# Patient Record
Sex: Female | Born: 1965 | Race: White | Hispanic: No | Marital: Married | Smoking: Former smoker
Health system: Southern US, Community
[De-identification: ages and names within clinical notes are randomized; demographics above are authoritative.]

## PROBLEM LIST (undated history)

## (undated) DIAGNOSIS — E119 Type 2 diabetes mellitus without complications: Secondary | ICD-10-CM

## (undated) DIAGNOSIS — G4730 Sleep apnea, unspecified: Secondary | ICD-10-CM

## (undated) DIAGNOSIS — E039 Hypothyroidism, unspecified: Secondary | ICD-10-CM

## (undated) DIAGNOSIS — C801 Malignant (primary) neoplasm, unspecified: Secondary | ICD-10-CM

## (undated) DIAGNOSIS — I10 Essential (primary) hypertension: Secondary | ICD-10-CM

## (undated) DIAGNOSIS — E7800 Pure hypercholesterolemia, unspecified: Secondary | ICD-10-CM

## (undated) DIAGNOSIS — F419 Anxiety disorder, unspecified: Secondary | ICD-10-CM

## (undated) DIAGNOSIS — K7460 Unspecified cirrhosis of liver: Secondary | ICD-10-CM

## (undated) DIAGNOSIS — E079 Disorder of thyroid, unspecified: Secondary | ICD-10-CM

## (undated) DIAGNOSIS — M1990 Unspecified osteoarthritis, unspecified site: Secondary | ICD-10-CM

## (undated) HISTORY — DX: Malignant (primary) neoplasm, unspecified: C80.1

## (undated) HISTORY — DX: Unspecified osteoarthritis, unspecified site: M19.90

## (undated) HISTORY — PX: ABDOMINAL HYSTERECTOMY: SHX81

## (undated) HISTORY — PX: JOINT REPLACEMENT: SHX530

## (undated) HISTORY — DX: Unspecified cirrhosis of liver: K74.60

## (undated) HISTORY — PX: THUMB ARTHROSCOPY: SHX2509

---

## 1998-09-22 ENCOUNTER — Encounter: Payer: Self-pay | Admitting: Obstetrics and Gynecology

## 1998-09-22 ENCOUNTER — Ambulatory Visit (HOSPITAL_COMMUNITY): Admission: RE | Admit: 1998-09-22 | Discharge: 1998-09-22 | Payer: Self-pay | Admitting: Obstetrics and Gynecology

## 2012-05-25 ENCOUNTER — Encounter (HOSPITAL_BASED_OUTPATIENT_CLINIC_OR_DEPARTMENT_OTHER): Payer: Self-pay | Admitting: Emergency Medicine

## 2012-05-25 ENCOUNTER — Emergency Department (HOSPITAL_BASED_OUTPATIENT_CLINIC_OR_DEPARTMENT_OTHER): Payer: Self-pay

## 2012-05-25 ENCOUNTER — Emergency Department (HOSPITAL_BASED_OUTPATIENT_CLINIC_OR_DEPARTMENT_OTHER)
Admission: EM | Admit: 2012-05-25 | Discharge: 2012-05-25 | Disposition: A | Discharge status: Home / Self Care | Payer: Self-pay | Attending: Emergency Medicine | Admitting: Emergency Medicine

## 2012-05-25 DIAGNOSIS — M1710 Unilateral primary osteoarthritis, unspecified knee: Secondary | ICD-10-CM | POA: Insufficient documentation

## 2012-05-25 DIAGNOSIS — M1990 Unspecified osteoarthritis, unspecified site: Secondary | ICD-10-CM

## 2012-05-25 DIAGNOSIS — M25569 Pain in unspecified knee: Secondary | ICD-10-CM

## 2012-05-25 DIAGNOSIS — Z87891 Personal history of nicotine dependence: Secondary | ICD-10-CM | POA: Insufficient documentation

## 2012-05-25 MED ORDER — HYDROCODONE-ACETAMINOPHEN 5-325 MG PO TABS
1.0000 | ORAL_TABLET | Freq: Four times a day (QID) | ORAL | Status: AC | PRN
Start: 2012-05-25 — End: 2012-06-04

## 2012-05-25 MED ORDER — HYDROCODONE-ACETAMINOPHEN 5-325 MG PO TABS
1.0000 | ORAL_TABLET | Freq: Once | ORAL | Status: AC
  Administered 2012-05-25: 1 via ORAL
  Filled 2012-05-25: qty 1

## 2012-05-25 MED ORDER — NAPROXEN 500 MG PO TABS: 500.0000 mg | ORAL_TABLET | Freq: Two times a day (BID) | ORAL | Status: AC

## 2012-05-25 NOTE — ED Provider Notes (Signed)
History  This chart was scribed for Mervin Kung, MD by Lovena Le Day. This patient was seen in room MH11/MH11 and the patient's care was started at 1424.   CSN: [id number]  Arrival date & time 05/25/12  1424   First MD Initiated Contact with Patient 05/25/12 1506      Chief Complaint  Patient presents with  . Knee Pain   Patient is a 46 y.o. female presenting with knee pain. The history is provided by the patient. No language interpreter was used.  Knee Pain This is a chronic problem. The current episode started more than 2 days ago. The problem occurs constantly. The problem has been gradually worsening. Associated symptoms include shortness of breath (At baseline. ). Pertinent negatives include no chest pain and no abdominal pain. The symptoms are aggravated by walking and bending. Nothing relieves the symptoms. Treatments tried: Ibuprofen.  The treatment provided no relief.   Priscilla Roberts is a 46 y.o. female who presents to the Emergency Department with history of chronic knee pain complaining of constant 10/10 left knee pain which started three days ago. She states pain and swelling to her knee which is worse with walking and sometimes she hears a popping noise.She saw an orthopedist 3 years ago who performed a knee X-ray and is unsure of what caused her pain. She has tried taking ibuprofen for pain at home without any relief from her symptoms. She states some SOB which is at her baseline as well as back pain which is also at baseline. She denies any numbness/swelling in her left foot. She denies any nausea, emesis, diarrhea, chest pain, and urinary symptoms.   Her PCP is Dr. Linwood Dibbles at [facility].   History reviewed. No pertinent past medical history.  Past Surgical History  Procedure Date  . Thumb arthroscopy   . Abdominal hysterectomy     No family history on file.  History  Substance Use Topics  . Smoking status: Former Research scientist (life sciences)  . Smokeless tobacco: Not on file   . Alcohol Use: No    OB History    Grav Para Term Preterm Abortions TAB SAB Ect Mult Living                  Review of Systems  Constitutional: Negative for fever and chills.  HENT: Negative for congestion.   Respiratory: Positive for shortness of breath (At baseline. ).   Cardiovascular: Negative for chest pain.  Gastrointestinal: Negative for nausea, vomiting, abdominal pain and diarrhea.  Genitourinary: Negative for dysuria and difficulty urinating.  Musculoskeletal: Positive for back pain (At baseline. ) and joint swelling (Left knee swollen).       Left knee pain.   Neurological: Negative for weakness and numbness.  All other systems reviewed and are negative.    Allergies  Tramadol  Home Medications   Current Outpatient Rx  Name Route Sig Dispense Refill  . ATENOLOL 25 MG PO TABS Oral Take 25 mg by mouth daily.    . ATORVASTATIN CALCIUM 10 MG PO TABS Oral Take 10 mg by mouth every evening.     . CYCLOBENZAPRINE HCL 10 MG PO TABS Oral Take 10 mg by mouth 3 (three) times daily as needed. For muscle spasms    . GABAPENTIN 300 MG PO CAPS Oral Take 300 mg by mouth 3 (three) times daily.    Marland Kitchen GLIPIZIDE 10 MG PO TABS Oral Take 20 mg by mouth 2 (two) times daily before a meal.    .  HYDROCHLOROTHIAZIDE 25 MG PO TABS Oral Take 25 mg by mouth daily.    . IBUPROFEN 200 MG PO TABS Oral Take 1,000 mg by mouth once as needed. For headache    . LEVOTHYROXINE SODIUM 150 MCG PO TABS Oral Take 150 mcg by mouth daily.    Marland Kitchen LISINOPRIL 2.5 MG PO TABS Oral Take 2.5 mg by mouth daily.    . MELOXICAM 15 MG PO TABS Oral Take 15 mg by mouth daily.    Marland Kitchen METFORMIN HCL 1000 MG PO TABS Oral Take 1,000 mg by mouth 2 (two) times daily with a meal.    . ADULT MULTIVITAMIN W/MINERALS CH Oral Take 1 tablet by mouth daily.    Marland Kitchen HAIR VITAMINS PO Oral Take 1 tablet by mouth daily.    Marland Kitchen SALINE NASAL SPRAY 0.65 % NA SOLN Nasal Place 2 sprays into the nose 2 (two) times daily as needed. For nasal congestion     . HYDROCODONE-ACETAMINOPHEN 5-325 MG PO TABS Oral Take 1-2 tablets by mouth every 6 (six) hours as needed for pain. 14 tablet 0  . NAPROXEN 500 MG PO TABS Oral Take 1 tablet (500 mg total) by mouth 2 (two) times daily. 14 tablet 0    Triage Vitals: BP 148/81  Pulse 87  Resp 16  Ht 5' 6"  (1.676 m)  Wt 220 lb (99.791 kg)  BMI 35.51 kg/m2  SpO2 98%  Physical Exam  Nursing note and vitals reviewed. Constitutional: She is oriented to person, place, and time. She appears well-developed and well-nourished. No distress.  HENT:  Head: Normocephalic and atraumatic.  Eyes: EOM are normal.  Neck: Neck supple. No tracheal deviation present.  Cardiovascular: Normal rate, regular rhythm and normal heart sounds.   No murmur heard. Pulmonary/Chest: Effort normal and breath sounds normal. No respiratory distress. She has no wheezes. She has no rales.  Abdominal: Soft. Bowel sounds are normal. She exhibits no distension. There is no tenderness. There is no rebound and no guarding.  Musculoskeletal: Normal range of motion. She exhibits edema and tenderness.       Small effusion of her left knee, minimally swollen, joint line tenderness.. Hint of increased warmth of her left knee . DP left and right 2+ , capillary refill bilateral big toes 1 second.  Neurological: She is alert and oriented to person, place, and time.  Skin: Skin is warm and dry.  Psychiatric: She has a normal mood and affect. Her behavior is normal.    ED Course  Procedures (including critical care time) DIAGNOSTIC STUDIES: Oxygen Saturation is 98% on room air, normal by my interpretation.    COORDINATION OF CARE: At 315 Discussed treatment plan with patient which includes follow up with her orthopedist. Patient agrees.   Labs Reviewed - No data to display Dg Knee Complete 4 Views Left  05/25/2012  *RADIOLOGY REPORT*  Clinical Data: Left knee pain  LEFT KNEE - COMPLETE 4+ VIEW  Comparison: None.  Findings: Mild  tricompartmental degenerative changes.  No fracture or dislocation is seen.  The visualized soft tissues are unremarkable.  Possible small suprapatellar knee joint effusion.  IMPRESSION: Mild tricompartmental degenerative changes.  Possible small suprapatellar knee joint effusion.   Original Report Authenticated By: Julian Hy, M.D.      1. Knee pain   2. Osteoarthritis       MDM  X-ray findings show significant tricompartmental degenerative changes even though they're mild and a small effusion all suggestive arthritic osteoarthritic changes. Take anti-inflammatory medicines and  we'll supplement with pain medicines orthopedic referral sports medicine referral provided.  I personally performed the services described in this documentation, which was scribed in my presence. The recorded information has been reviewed and considered.           Mervin Kung, MD 05/25/12 1700

## 2012-05-25 NOTE — ED Notes (Signed)
Pt c/o LT knee pain, chronic, but worse over last 2-3 days

## 2012-05-31 ENCOUNTER — Ambulatory Visit (HOSPITAL_COMMUNITY)
Admission: RE | Admit: 2012-05-31 | Discharge: 2012-05-31 | Disposition: A | Discharge status: Home / Self Care | Payer: Self-pay | Source: Ambulatory Visit | Attending: Orthopedic Surgery | Admitting: Orthopedic Surgery

## 2012-05-31 DIAGNOSIS — M7989 Other specified soft tissue disorders: Secondary | ICD-10-CM | POA: Insufficient documentation

## 2012-05-31 DIAGNOSIS — M79609 Pain in unspecified limb: Secondary | ICD-10-CM | POA: Insufficient documentation

## 2012-05-31 DIAGNOSIS — M79606 Pain in leg, unspecified: Secondary | ICD-10-CM

## 2012-05-31 NOTE — Progress Notes (Signed)
VASCULAR LAB PRELIMINARY  PRELIMINARY  PRELIMINARY  PRELIMINARY  Left lower extremity venous duplex completed.    Preliminary report:  Left:  No evidence of DVT, superficial thrombosis, or Baker's cyst.  Priscilla Roberts, RVT 05/31/2012, 11:47 AM

## 2012-07-24 ENCOUNTER — Emergency Department (HOSPITAL_BASED_OUTPATIENT_CLINIC_OR_DEPARTMENT_OTHER)
Admission: EM | Admit: 2012-07-24 | Discharge: 2012-07-24 | Disposition: A | Discharge status: Home / Self Care | Payer: Self-pay | Attending: Emergency Medicine | Admitting: Emergency Medicine

## 2012-07-24 ENCOUNTER — Emergency Department (HOSPITAL_BASED_OUTPATIENT_CLINIC_OR_DEPARTMENT_OTHER): Payer: Self-pay

## 2012-07-24 ENCOUNTER — Encounter (HOSPITAL_BASED_OUTPATIENT_CLINIC_OR_DEPARTMENT_OTHER): Payer: Self-pay

## 2012-07-24 DIAGNOSIS — Z79899 Other long term (current) drug therapy: Secondary | ICD-10-CM | POA: Insufficient documentation

## 2012-07-24 DIAGNOSIS — E079 Disorder of thyroid, unspecified: Secondary | ICD-10-CM | POA: Insufficient documentation

## 2012-07-24 DIAGNOSIS — IMO0002 Reserved for concepts with insufficient information to code with codable children: Secondary | ICD-10-CM | POA: Insufficient documentation

## 2012-07-24 DIAGNOSIS — E785 Hyperlipidemia, unspecified: Secondary | ICD-10-CM | POA: Insufficient documentation

## 2012-07-24 DIAGNOSIS — Y9389 Activity, other specified: Secondary | ICD-10-CM | POA: Insufficient documentation

## 2012-07-24 DIAGNOSIS — Y929 Unspecified place or not applicable: Secondary | ICD-10-CM | POA: Insufficient documentation

## 2012-07-24 DIAGNOSIS — S63601A Unspecified sprain of right thumb, initial encounter: Secondary | ICD-10-CM

## 2012-07-24 DIAGNOSIS — E119 Type 2 diabetes mellitus without complications: Secondary | ICD-10-CM | POA: Insufficient documentation

## 2012-07-24 DIAGNOSIS — I10 Essential (primary) hypertension: Secondary | ICD-10-CM | POA: Insufficient documentation

## 2012-07-24 DIAGNOSIS — Z87891 Personal history of nicotine dependence: Secondary | ICD-10-CM | POA: Insufficient documentation

## 2012-07-24 HISTORY — DX: Type 2 diabetes mellitus without complications: E11.9

## 2012-07-24 HISTORY — DX: Disorder of thyroid, unspecified: E07.9

## 2012-07-24 HISTORY — DX: Essential (primary) hypertension: I10

## 2012-07-24 HISTORY — DX: Pure hypercholesterolemia, unspecified: E78.00

## 2012-07-24 MED ORDER — IBUPROFEN 800 MG PO TABS: 800.0000 mg | ORAL_TABLET | Freq: Three times a day (TID) | ORAL | Status: DC

## 2012-07-24 MED ORDER — HYDROCODONE-ACETAMINOPHEN 5-325 MG PO TABS: 2.0000 | ORAL_TABLET | ORAL | Status: DC | PRN

## 2012-07-24 MED ORDER — HYDROCODONE-ACETAMINOPHEN 5-325 MG PO TABS
2.0000 | ORAL_TABLET | Freq: Once | ORAL | Status: AC
  Administered 2012-07-24: 2 via ORAL
  Filled 2012-07-24: qty 2

## 2012-07-24 NOTE — ED Notes (Signed)
C/o pain to right thumb x 1 week-denies injury

## 2012-07-24 NOTE — ED Provider Notes (Signed)
Medical screening examination/treatment/procedure(s) were performed by non-physician practitioner and as supervising physician I was immediately available for consultation/collaboration.   Carmin Muskrat, MD 07/24/12 2125

## 2012-07-24 NOTE — ED Provider Notes (Signed)
History     CSN: [id number]  Arrival date & time 07/24/12  0160   First MD Initiated Contact with Patient 07/24/12 1853      Chief Complaint  Patient presents with  . Hand Pain    (Consider location/radiation/quality/duration/timing/severity/associated sxs/prior treatment) Patient is a 45 y.o. female presenting with hand injury. The history is provided by the patient. No language interpreter was used.  Hand Injury  The incident occurred more than 1 week ago. The incident occurred at home. The injury mechanism was a direct blow. The pain is present in the right hand. The quality of the pain is described as aching. The pain is at a severity of 4/10. The pain is moderate. The pain has been constant since the incident. She reports no foreign bodies present. The symptoms are aggravated by movement. She has tried nothing for the symptoms. The treatment provided no relief.  Pt reports she spanked a child last week and hand has been sore since.  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  Past Medical History  Diagnosis Date  . Diabetes mellitus without complication   . Hypertension   . Thyroid disease   . High cholesterol     Past Surgical History  Procedure Date  . Thumb arthroscopy   . Abdominal hysterectomy     No family history on file.  History  Substance Use Topics  . Smoking status: Former Research scientist (life sciences)  . Smokeless tobacco: Not on file  . Alcohol Use: No    OB History    Grav Para Term Preterm Abortions TAB SAB Ect Mult Living                  Review of Systems  Musculoskeletal: Positive for joint swelling.  All other systems reviewed and are negative.    Allergies  Tramadol  Home Medications   Current Outpatient Rx  Name Route Sig Dispense Refill  . ATENOLOL 25 MG PO TABS Oral Take 25 mg by mouth daily.    . ATORVASTATIN CALCIUM 10 MG PO TABS Oral Take 10 mg by mouth every evening.     . CYCLOBENZAPRINE HCL 10 MG PO  TABS Oral Take 10 mg by mouth 3 (three) times daily as needed. For muscle spasms    . GABAPENTIN 300 MG PO CAPS Oral Take 300 mg by mouth 3 (three) times daily.    Marland Kitchen GLIPIZIDE 10 MG PO TABS Oral Take 20 mg by mouth 2 (two) times daily before a meal.    . HYDROCHLOROTHIAZIDE 25 MG PO TABS Oral Take 25 mg by mouth daily.    . IBUPROFEN 200 MG PO TABS Oral Take 1,000 mg by mouth once as needed. For headache    . LEVOTHYROXINE SODIUM 150 MCG PO TABS Oral Take 150 mcg by mouth daily.    Marland Kitchen LISINOPRIL 2.5 MG PO TABS Oral Take 2.5 mg by mouth daily.    . MELOXICAM 15 MG PO TABS Oral Take 15 mg by mouth daily.    Marland Kitchen METFORMIN HCL 1000 MG PO TABS Oral Take 1,000 mg by mouth 2 (two) times daily with a meal.    . ADULT MULTIVITAMIN W/MINERALS CH Oral Take 1 tablet by mouth daily.    Marland Kitchen HAIR VITAMINS PO Oral Take 1 tablet by mouth daily.    Marland Kitchen NAPROXEN 500 MG PO TABS Oral Take 1 tablet (500 mg total) by mouth 2 (two) times daily. 14 tablet 0  .  SALINE NASAL SPRAY 0.65 % NA SOLN Nasal Place 2 sprays into the nose 2 (two) times daily as needed. For nasal congestion      BP 143/79  Pulse 100  Temp 99 F (37.2 C) (Oral)  Resp 16  Ht 5' 6"  (1.676 m)  Wt 226 lb (102.513 kg)  BMI 36.48 kg/m2  SpO2 96%  Physical Exam  Nursing note and vitals reviewed. Constitutional: She is oriented to person, place, and time. She appears well-developed and well-nourished.  HENT:  Head: Normocephalic and atraumatic.  Musculoskeletal: She exhibits tenderness.       Tender right thumb,  Swelling thenar prominence.    Neurological: She is alert and oriented to person, place, and time. She has normal reflexes.  Skin: Skin is warm.  Psychiatric: She has a normal mood and affect.    ED Course  Procedures (including critical care time)  Labs Reviewed - No data to display Dg Hand Complete Right  07/24/2012  *RADIOLOGY REPORT*  Clinical Data: Right thumb pain.  RIGHT HAND - COMPLETE 3+ VIEW  Comparison: None.  Findings:  Three views of the right hand were obtained.  There is a 5 mm linear density and a smaller adjacent radiopaque structure within the palmar soft tissues overlying the second metacarpal bone.  Findings are consistent with small foreign bodies.  There are degenerative changes at the first carpometacarpal joint.  Old injury or incomplete ossification of the ulnar styloid. Degenerative changes involving the middle finger DIP joint.  Mild degenerative changes involving the thumb MCP joint.  IMPRESSION: Osteoarthritis as described.  No acute bony abnormality.  Two small foreign bodies in the soft tissues overlying the second metacarpal bone.   Original Report Authenticated By: Markus Daft, M.D.      1. Sprain of hand, thumb, right       MDM  Pt placed in thumb spica.  I advised wear for 1 week.  Follow up with Dr. Burney Gauze or Orthopaedist of your choice in 1 week if pain persist       [place], [place] 07/24/12 [place], [place] 07/24/12 [phone]

## 2012-09-30 ENCOUNTER — Emergency Department (HOSPITAL_BASED_OUTPATIENT_CLINIC_OR_DEPARTMENT_OTHER)
Admission: EM | Admit: 2012-09-30 | Discharge: 2012-09-30 | Disposition: A | Discharge status: Home / Self Care | Payer: Self-pay | Attending: Emergency Medicine | Admitting: Emergency Medicine

## 2012-09-30 ENCOUNTER — Encounter (HOSPITAL_BASED_OUTPATIENT_CLINIC_OR_DEPARTMENT_OTHER): Payer: Self-pay

## 2012-09-30 ENCOUNTER — Emergency Department (HOSPITAL_BASED_OUTPATIENT_CLINIC_OR_DEPARTMENT_OTHER): Payer: Self-pay

## 2012-09-30 DIAGNOSIS — R197 Diarrhea, unspecified: Secondary | ICD-10-CM | POA: Insufficient documentation

## 2012-09-30 DIAGNOSIS — B9789 Other viral agents as the cause of diseases classified elsewhere: Secondary | ICD-10-CM | POA: Insufficient documentation

## 2012-09-30 DIAGNOSIS — E079 Disorder of thyroid, unspecified: Secondary | ICD-10-CM | POA: Insufficient documentation

## 2012-09-30 DIAGNOSIS — G43909 Migraine, unspecified, not intractable, without status migrainosus: Secondary | ICD-10-CM

## 2012-09-30 DIAGNOSIS — Z79899 Other long term (current) drug therapy: Secondary | ICD-10-CM | POA: Insufficient documentation

## 2012-09-30 DIAGNOSIS — R112 Nausea with vomiting, unspecified: Secondary | ICD-10-CM | POA: Insufficient documentation

## 2012-09-30 DIAGNOSIS — J209 Acute bronchitis, unspecified: Secondary | ICD-10-CM | POA: Insufficient documentation

## 2012-09-30 DIAGNOSIS — E7800 Pure hypercholesterolemia, unspecified: Secondary | ICD-10-CM | POA: Insufficient documentation

## 2012-09-30 DIAGNOSIS — J029 Acute pharyngitis, unspecified: Secondary | ICD-10-CM | POA: Insufficient documentation

## 2012-09-30 DIAGNOSIS — R062 Wheezing: Secondary | ICD-10-CM | POA: Insufficient documentation

## 2012-09-30 DIAGNOSIS — H53149 Visual discomfort, unspecified: Secondary | ICD-10-CM | POA: Insufficient documentation

## 2012-09-30 DIAGNOSIS — E119 Type 2 diabetes mellitus without complications: Secondary | ICD-10-CM | POA: Insufficient documentation

## 2012-09-30 DIAGNOSIS — I10 Essential (primary) hypertension: Secondary | ICD-10-CM | POA: Insufficient documentation

## 2012-09-30 DIAGNOSIS — B349 Viral infection, unspecified: Secondary | ICD-10-CM

## 2012-09-30 DIAGNOSIS — Z87891 Personal history of nicotine dependence: Secondary | ICD-10-CM | POA: Insufficient documentation

## 2012-09-30 DIAGNOSIS — J3489 Other specified disorders of nose and nasal sinuses: Secondary | ICD-10-CM | POA: Insufficient documentation

## 2012-09-30 MED ORDER — ALBUTEROL SULFATE HFA 108 (90 BASE) MCG/ACT IN AERS
2.0000 | INHALATION_SPRAY | Freq: Once | RESPIRATORY_TRACT | Status: AC
  Administered 2012-09-30: 2 via RESPIRATORY_TRACT
  Filled 2012-09-30: qty 6.7

## 2012-09-30 MED ORDER — METOCLOPRAMIDE HCL 10 MG PO TABS
10.0000 mg | ORAL_TABLET | Freq: Once | ORAL | Status: AC
  Administered 2012-09-30: 10 mg via ORAL
  Filled 2012-09-30: qty 1

## 2012-09-30 MED ORDER — PREDNISONE 20 MG PO TABS: 40.0000 mg | ORAL_TABLET | Freq: Every day | ORAL | Status: DC

## 2012-09-30 MED ORDER — OXYCODONE-ACETAMINOPHEN 5-325 MG PO TABS
1.0000 | ORAL_TABLET | Freq: Once | ORAL | Status: AC
  Administered 2012-09-30: 1 via ORAL
  Filled 2012-09-30 (×2): qty 1

## 2012-09-30 MED ORDER — BENZONATATE 100 MG PO CAPS: 200.0000 mg | ORAL_CAPSULE | Freq: Two times a day (BID) | ORAL | Status: DC | PRN

## 2012-09-30 NOTE — ED Notes (Signed)
Prod cough x 1 month-no medical treatment-taking OTC meds w/o relief

## 2012-09-30 NOTE — ED Provider Notes (Signed)
History  This chart was scribed for Priscilla Mayo, MD by Era Bumpers, ED scribe. This patient was seen in room MH05/MH05 and the patient's care was started at 1516.   CSN: [id number]  Arrival date & time 09/30/12  1516   First MD Initiated Contact with Patient 09/30/12 1630      Chief Complaint  Patient presents with  . Cough   Patient is a 46 y.o. female presenting with cough. The history is provided by the patient. No language interpreter was used.  Cough This is a new problem. The current episode started more than 1 week ago. The problem occurs constantly. The problem has been gradually worsening. The cough is productive of brown sputum. There has been no fever. Associated symptoms include headaches, sore throat and wheezing. Pertinent negatives include no chest pain, no chills and no shortness of breath. She has tried nothing for the symptoms. She is not a smoker.   ALAYSHA Roberts is a 46 y.o. female who presents to the Emergency Department w/hx of migraines complaining of constant gradually worsening productive (green sputum) cough for the past month which began as a sore thorat with associated HA, wheezing, nausea, emesis, and diarrhea. She states photophobia, which is normal with past migraine episodes. She states no relief with OTC medicines at home.   She has no PCP  Past Medical History  Diagnosis Date  . Diabetes mellitus without complication   . Hypertension   . Thyroid disease   . High cholesterol     Past Surgical History  Procedure Date  . Thumb arthroscopy   . Abdominal hysterectomy     No family history on file.  History  Substance Use Topics  . Smoking status: Former Research scientist (life sciences)  . Smokeless tobacco: Not on file  . Alcohol Use: No    OB History    Grav Para Term Preterm Abortions TAB SAB Ect Mult Living                  Review of Systems  Constitutional: Negative for fever and chills.  HENT: Positive for congestion and sore throat.   Eyes:  Positive for photophobia.  Respiratory: Positive for cough and wheezing. Negative for shortness of breath.   Cardiovascular: Negative for chest pain.  Gastrointestinal: Positive for nausea, vomiting and diarrhea. Negative for abdominal pain.  Neurological: Positive for headaches. Negative for weakness.  All other systems reviewed and are negative.    Allergies  Tramadol  Home Medications   Current Outpatient Rx  Name  Route  Sig  Dispense  Refill  . ATENOLOL 25 MG PO TABS   Oral   Take 25 mg by mouth daily.         . ATORVASTATIN CALCIUM 10 MG PO TABS   Oral   Take 10 mg by mouth every evening.          . CYCLOBENZAPRINE HCL 10 MG PO TABS   Oral   Take 10 mg by mouth 3 (three) times daily as needed. For muscle spasms         . GABAPENTIN 300 MG PO CAPS   Oral   Take 300 mg by mouth 3 (three) times daily.         Marland Kitchen GLIPIZIDE 10 MG PO TABS   Oral   Take 20 mg by mouth 2 (two) times daily before a meal.         . HYDROCHLOROTHIAZIDE 25 MG PO TABS   Oral  Take 25 mg by mouth daily.         Marland Kitchen HYDROCODONE-ACETAMINOPHEN 5-325 MG PO TABS   Oral   Take 2 tablets by mouth every 4 (four) hours as needed for pain.   10 tablet   0   . IBUPROFEN 200 MG PO TABS   Oral   Take 1,000 mg by mouth once as needed. For headache         . IBUPROFEN 800 MG PO TABS   Oral   Take 1 tablet (800 mg total) by mouth 3 (three) times daily.   21 tablet   0   . LEVOTHYROXINE SODIUM 150 MCG PO TABS   Oral   Take 150 mcg by mouth daily.         Marland Kitchen LISINOPRIL 2.5 MG PO TABS   Oral   Take 2.5 mg by mouth daily.         . MELOXICAM 15 MG PO TABS   Oral   Take 15 mg by mouth daily.         Marland Kitchen METFORMIN HCL 1000 MG PO TABS   Oral   Take 1,000 mg by mouth 2 (two) times daily with a meal.         . ADULT MULTIVITAMIN W/MINERALS CH   Oral   Take 1 tablet by mouth daily.         Marland Kitchen HAIR VITAMINS PO   Oral   Take 1 tablet by mouth daily.         Marland Kitchen NAPROXEN  500 MG PO TABS   Oral   Take 1 tablet (500 mg total) by mouth 2 (two) times daily.   14 tablet   0   . SALINE NASAL SPRAY 0.65 % NA SOLN   Nasal   Place 2 sprays into the nose 2 (two) times daily as needed. For nasal congestion           Triage Vitals: BP 110/65  Pulse 85  Temp 98.5 F (36.9 C) (Oral)  Resp 16  Ht 5' 6"  (1.676 m)  Wt 210 lb (95.255 kg)  BMI 33.89 kg/m2  SpO2 98%  Physical Exam  Nursing note and vitals reviewed. Constitutional: She is oriented to person, place, and time. She appears well-developed and well-nourished. No distress.  HENT:  Head: Normocephalic and atraumatic.  Eyes: EOM are normal.  Neck: Neck supple. No tracheal deviation present.  Cardiovascular: Normal rate.   Pulmonary/Chest: Effort normal. No respiratory distress.  Musculoskeletal: Normal range of motion.  Neurological: She is alert and oriented to person, place, and time.  Skin: Skin is warm and dry.  Psychiatric: She has a normal mood and affect. Her behavior is normal.    ED Course  Procedures (including critical care time) DIAGNOSTIC STUDIES: Oxygen Saturation is 98% on room air, normal by my interpretation.    COORDINATION OF CARE: At 455 PM Discussed treatment plan with patient which includes breathing treatment, CXR, pain medicine, reglan. Patient agrees.   Labs Reviewed - No data to display No results found.   No diagnosis found.    MDM  1900.  Pt stable, NAD.  Cough improved s/p albuterol.  Pt has no infiltrate on CXR and an exam and history consistent with bronchitis.  Will treat with a steroid taper, prn albuterol, and tessalon.    I personally performed the services described in this documentation, which was scribed in my presence. The recorded information has been reviewed and is accurate.  }  Priscilla Mayo, MD 09/30/12 [phone]

## 2012-10-04 ENCOUNTER — Emergency Department (INDEPENDENT_AMBULATORY_CARE_PROVIDER_SITE_OTHER): Payer: Self-pay

## 2012-10-04 ENCOUNTER — Emergency Department (INDEPENDENT_AMBULATORY_CARE_PROVIDER_SITE_OTHER)
Admission: EM | Admit: 2012-10-04 | Discharge: 2012-10-04 | Disposition: A | Discharge status: Home / Self Care | Payer: Self-pay | Source: Home / Self Care | Attending: Family Medicine | Admitting: Family Medicine

## 2012-10-04 ENCOUNTER — Encounter (HOSPITAL_COMMUNITY): Payer: Self-pay | Admitting: *Deleted

## 2012-10-04 DIAGNOSIS — J45901 Unspecified asthma with (acute) exacerbation: Secondary | ICD-10-CM

## 2012-10-04 MED ORDER — ALBUTEROL SULFATE (5 MG/ML) 0.5% IN NEBU
INHALATION_SOLUTION | RESPIRATORY_TRACT | Status: AC
  Filled 2012-10-04: qty 1

## 2012-10-04 MED ORDER — IPRATROPIUM BROMIDE 0.02 % IN SOLN
0.5000 mg | Freq: Once | RESPIRATORY_TRACT | Status: AC
  Administered 2012-10-04: 0.5 mg via RESPIRATORY_TRACT

## 2012-10-04 MED ORDER — ALBUTEROL SULFATE (5 MG/ML) 0.5% IN NEBU
5.0000 mg | INHALATION_SOLUTION | Freq: Once | RESPIRATORY_TRACT | Status: AC
  Administered 2012-10-04: 5 mg via RESPIRATORY_TRACT

## 2012-10-04 MED ORDER — BENZONATATE 200 MG PO CAPS
200.0000 mg | ORAL_CAPSULE | Freq: Three times a day (TID) | ORAL | Status: DC | PRN
Start: 2012-10-04 — End: 2021-09-19

## 2012-10-04 MED ORDER — METHYLPREDNISOLONE ACETATE 80 MG/ML IJ SUSP
INTRAMUSCULAR | Status: AC
  Filled 2012-10-04: qty 1

## 2012-10-04 MED ORDER — LEVOFLOXACIN 500 MG PO TABS: 500.0000 mg | ORAL_TABLET | Freq: Every day | ORAL | Status: DC

## 2012-10-04 MED ORDER — HYDROCOD POLST-CHLORPHEN POLST 10-8 MG/5ML PO LQCR: 5.0000 mL | Freq: Two times a day (BID) | ORAL | Status: DC

## 2012-10-04 MED ORDER — METHYLPREDNISOLONE ACETATE 40 MG/ML IJ SUSP
80.0000 mg | Freq: Once | INTRAMUSCULAR | Status: AC
  Administered 2012-10-04: 80 mg via INTRAMUSCULAR

## 2012-10-04 NOTE — ED Notes (Signed)
Pt  Was  Seen  A  Few  Days  Ago  At [facility]    She  Was  Seen  For  Bronchospasm    Viral  Syndrome  And  Migraine   -  She  Reports      Still  Having symptoms  Of  Cough /  Congestion  And  Headache            She  Reports  She   Got  All  Her RX  Filled

## 2012-10-04 NOTE — ED Notes (Signed)
At 6:20, received call from patient, unable to afford her Rx, and asking for something more affordable than Levaquin and tussionex  Spoke w Dr Denton Ar, who authorized rx for z-pack to be called in for patient, and she is to [place] Robitussin DM instead of tussionex and tessalon for cough control . New Rx called in to [facility], [address] in [place]

## 2012-10-04 NOTE — ED Provider Notes (Signed)
History     CSN: [id number]  Arrival date & time 10/04/12  1316   First MD Initiated Contact with Patient 10/04/12 1456      Chief Complaint  Patient presents with  . Cough    (Consider location/radiation/quality/duration/timing/severity/associated sxs/prior treatment) Patient is a 47 y.o. female presenting with cough. The history is provided by the patient and the spouse.  Cough This is a new problem. The current episode started more than 2 days ago. The problem has not changed (seen at [facility]. is taking meds and cough continues.) since onset.The cough is non-productive. There has been no fever. Associated symptoms include wheezing. Pertinent negatives include no rhinorrhea and no sore throat.    Past Medical History  Diagnosis Date  . Diabetes mellitus without complication   . Hypertension   . Thyroid disease   . High cholesterol     Past Surgical History  Procedure Date  . Thumb arthroscopy   . Abdominal hysterectomy     No family history on file.  History  Substance Use Topics  . Smoking status: Former Research scientist (life sciences)  . Smokeless tobacco: Not on file  . Alcohol Use: No    OB History    Grav Para Term Preterm Abortions TAB SAB Ect Mult Living                  Review of Systems  Constitutional: Negative.   HENT: Negative for congestion, sore throat and rhinorrhea.   Respiratory: Positive for cough and wheezing.     Allergies  Tramadol  Home Medications   Current Outpatient Rx  Name  Route  Sig  Dispense  Refill  . ATENOLOL 25 MG PO TABS   Oral   Take 25 mg by mouth daily.         . ATORVASTATIN CALCIUM 10 MG PO TABS   Oral   Take 10 mg by mouth every evening.          Marland Kitchen BENZONATATE 100 MG PO CAPS   Oral   Take 2 capsules (200 mg total) by mouth 2 (two) times daily as needed for cough.   20 capsule   0   . BENZONATATE 200 MG PO CAPS   Oral   Take 1 capsule (200 mg total) by mouth 3 (three) times daily as needed for cough.   30 capsule   1     . HYDROCOD POLST-CPM POLST ER 10-8 MG/5ML PO LQCR   Oral   Take 5 mLs by mouth every 12 (twelve) hours.   115 mL   0   . CYCLOBENZAPRINE HCL 10 MG PO TABS   Oral   Take 10 mg by mouth 3 (three) times daily as needed. For muscle spasms         . GABAPENTIN 300 MG PO CAPS   Oral   Take 300 mg by mouth 3 (three) times daily.         Marland Kitchen GLIPIZIDE 10 MG PO TABS   Oral   Take 20 mg by mouth 2 (two) times daily before a meal.         . HYDROCHLOROTHIAZIDE 25 MG PO TABS   Oral   Take 25 mg by mouth daily.         Marland Kitchen HYDROCODONE-ACETAMINOPHEN 5-325 MG PO TABS   Oral   Take 2 tablets by mouth every 4 (four) hours as needed for pain.   10 tablet   0   . IBUPROFEN 200 MG PO TABS  Oral   Take 1,000 mg by mouth once as needed. For headache         . IBUPROFEN 800 MG PO TABS   Oral   Take 1 tablet (800 mg total) by mouth 3 (three) times daily.   21 tablet   0   . LEVOFLOXACIN 500 MG PO TABS   Oral   Take 1 tablet (500 mg total) by mouth daily.   10 tablet   0   . LEVOTHYROXINE SODIUM 150 MCG PO TABS   Oral   Take 150 mcg by mouth daily.         Marland Kitchen LISINOPRIL 2.5 MG PO TABS   Oral   Take 2.5 mg by mouth daily.         . MELOXICAM 15 MG PO TABS   Oral   Take 15 mg by mouth daily.         Marland Kitchen METFORMIN HCL 1000 MG PO TABS   Oral   Take 1,000 mg by mouth 2 (two) times daily with a meal.         . ADULT MULTIVITAMIN W/MINERALS CH   Oral   Take 1 tablet by mouth daily.         Marland Kitchen HAIR VITAMINS PO   Oral   Take 1 tablet by mouth daily.         Marland Kitchen NAPROXEN 500 MG PO TABS   Oral   Take 1 tablet (500 mg total) by mouth 2 (two) times daily.   14 tablet   0   . PREDNISONE 20 MG PO TABS   Oral   Take 2 tablets (40 mg total) by mouth daily. Take 40 mg by mouth daily for 3 days, then 72m by mouth daily for 3 days, then 118mdaily for 3 days   12 tablet   0   . SALINE NASAL SPRAY 0.65 % NA SOLN   Nasal   Place 2 sprays into the nose 2 (two) times  daily as needed. For nasal congestion           BP 140/76  Pulse 72  Temp 98.6 F (37 C)  Resp 20  SpO2 98%  Physical Exam  Nursing note and vitals reviewed. Constitutional: She is oriented to person, place, and time. She appears well-developed and well-nourished.  HENT:  Head: Normocephalic.  Right Ear: External ear normal.  Left Ear: External ear normal.  Mouth/Throat: Oropharynx is clear and moist.  Eyes: Pupils are equal, round, and reactive to light.  Neck: Normal range of motion. Neck supple.  Cardiovascular: Normal rate, normal heart sounds and intact distal pulses.   Pulmonary/Chest: Effort normal and breath sounds normal.  Abdominal: Soft. Bowel sounds are normal.  Musculoskeletal: She exhibits no edema.  Lymphadenopathy:    She has no cervical adenopathy.  Neurological: She is alert and oriented to person, place, and time.  Skin: Skin is warm and dry.  Psychiatric: She has a normal mood and affect.    ED Course  Procedures (including critical care time)  Labs Reviewed - No data to display Dg Chest 2 View  10/04/2012  *RADIOLOGY REPORT*  Clinical Data: [age]ear old female with cough and congestion.  CHEST - 2 VIEW  Comparison: 09/30/2012  Findings: The cardiomediastinal silhouette is stable. Right basilar atelectasis/scarring and elevation of the right hemidiaphragm again noted. Mild peribronchial thickening is unchanged. There is no evidence of focal airspace disease, pulmonary edema, suspicious pulmonary nodule/mass, pleural effusion, or pneumothorax.  No acute bony abnormalities are identified.  IMPRESSION: No evidence of acute cardiopulmonary disease.  Mild peribronchial thickening without focal pneumonia.  Continued right basilar atelectasis/scarring and right hemidiaphragm elevation.   Original Report Authenticated By: Margarette Canada, M.D.      1. Asthmatic bronchitis with exacerbation       MDM  X-rays reviewed and report per  radiologist.         Billy Fischer, MD 10/04/12 [phone]

## 2012-10-04 NOTE — ED Notes (Signed)
Report   Given  To  Vinnie Level       rn         Pt      Reports         She  Is  Feeling        Better

## 2012-11-08 ENCOUNTER — Encounter (HOSPITAL_BASED_OUTPATIENT_CLINIC_OR_DEPARTMENT_OTHER): Payer: Self-pay | Admitting: *Deleted

## 2012-11-08 ENCOUNTER — Emergency Department (HOSPITAL_BASED_OUTPATIENT_CLINIC_OR_DEPARTMENT_OTHER)
Admission: EM | Admit: 2012-11-08 | Discharge: 2012-11-08 | Disposition: A | Discharge status: Home / Self Care | Payer: Self-pay | Attending: Emergency Medicine | Admitting: Emergency Medicine

## 2012-11-08 DIAGNOSIS — Z79899 Other long term (current) drug therapy: Secondary | ICD-10-CM | POA: Insufficient documentation

## 2012-11-08 DIAGNOSIS — E039 Hypothyroidism, unspecified: Secondary | ICD-10-CM | POA: Insufficient documentation

## 2012-11-08 DIAGNOSIS — Z87891 Personal history of nicotine dependence: Secondary | ICD-10-CM | POA: Insufficient documentation

## 2012-11-08 DIAGNOSIS — R51 Headache: Secondary | ICD-10-CM | POA: Insufficient documentation

## 2012-11-08 DIAGNOSIS — I10 Essential (primary) hypertension: Secondary | ICD-10-CM | POA: Insufficient documentation

## 2012-11-08 DIAGNOSIS — Z8679 Personal history of other diseases of the circulatory system: Secondary | ICD-10-CM | POA: Insufficient documentation

## 2012-11-08 DIAGNOSIS — E7800 Pure hypercholesterolemia, unspecified: Secondary | ICD-10-CM | POA: Insufficient documentation

## 2012-11-08 DIAGNOSIS — E119 Type 2 diabetes mellitus without complications: Secondary | ICD-10-CM | POA: Insufficient documentation

## 2012-11-08 LAB — URINE MICROSCOPIC-ADD ON

## 2012-11-08 LAB — BASIC METABOLIC PANEL
BUN: 14 mg/dL (ref 6–23)
Calcium: 10.2 mg/dL (ref 8.4–10.5)
GFR calc non Af Amer: >90 mL/min (ref >90)
Glucose, Bld: 210 mg/dL — ABNORMAL HIGH (ref 70–99)

## 2012-11-08 LAB — CBC WITH DIFFERENTIAL/PLATELET
Eosinophils Absolute: 0.2 10*3/uL (ref 0.0–0.7)
Eosinophils Relative: 3 % (ref 0–5)
HCT: 32.9 % — ABNORMAL LOW (ref 36.0–46.0)
Hemoglobin: 10.6 g/dL — ABNORMAL LOW (ref 12.0–15.0)
Lymphs Abs: 1.8 10*3/uL (ref 0.7–4.0)
MCH: 27.6 pg (ref 26.0–34.0)
MCV: 85.7 fL (ref 78.0–100.0)
Monocytes Relative: 6 % (ref 3–12)
RBC: 3.84 MIL/uL — ABNORMAL LOW (ref 3.87–5.11)

## 2012-11-08 LAB — URINALYSIS, ROUTINE W REFLEX MICROSCOPIC
Bilirubin Urine: NEGATIVE
Glucose, UA: NEGATIVE mg/dL
Protein, ur: NEGATIVE mg/dL
Specific Gravity, Urine: 1.009 (ref 1.005–1.030)
Urobilinogen, UA: 1 mg/dL (ref 0.0–1.0)

## 2012-11-08 MED ORDER — PREDNISONE 20 MG PO TABS: ORAL_TABLET | ORAL | Status: DC

## 2012-11-08 MED ORDER — PREDNISONE 50 MG PO TABS
60.0000 mg | ORAL_TABLET | Freq: Once | ORAL | Status: AC
  Administered 2012-11-08: 60 mg via ORAL
  Filled 2012-11-08: qty 1

## 2012-11-08 NOTE — ED Notes (Signed)
Pt c/o h/a sharp pain to left temple x 3 days

## 2012-11-08 NOTE — ED Provider Notes (Signed)
History     CSN: [id number]  Arrival date & time 11/08/12  1631   First MD Initiated Contact with Patient 11/08/12 1647      Chief Complaint  Patient presents with  . Headache    (Consider location/radiation/quality/duration/timing/severity/associated sxs/prior treatment) HPI Comments: Pt presents today for left temple pain.  Has experienced this pain intermittently since December but has been constant for the past 3 days.  Pain described as sharp, non-pulsatile, and the sensation of "someone driving an ice-pick into my head" per patient.  No recent trauma to the head.  Has a hx of migraines but states this is unlike any previous headaches.  Pt is diabetic and last eye exam date is unknown.  Denies any changes in vision, dizziness, photophobia, or nausea.  Patient is a 47 y.o. female presenting with headaches. The history is provided by the patient.  Headache     Past Medical History  Diagnosis Date  . Diabetes mellitus without complication   . Hypertension   . Thyroid disease   . High cholesterol     Past Surgical History  Procedure Date  . Thumb arthroscopy   . Abdominal hysterectomy     History reviewed. No pertinent family history.  History  Substance Use Topics  . Smoking status: Former Research scientist (life sciences)  . Smokeless tobacco: Not on file  . Alcohol Use: No    OB History    Grav Para Term Preterm Abortions TAB SAB Ect Mult Living                  Review of Systems  Neurological: Positive for headaches (left temple pain).  All other systems reviewed and are negative.    Allergies  Tramadol  Home Medications   Current Outpatient Rx  Name  Route  Sig  Dispense  Refill  . ATENOLOL 25 MG PO TABS   Oral   Take 25 mg by mouth daily.         . ATORVASTATIN CALCIUM 10 MG PO TABS   Oral   Take 10 mg by mouth every evening.          Marland Kitchen BENZONATATE 100 MG PO CAPS   Oral   Take 2 capsules (200 mg total) by mouth 2 (two) times daily as needed for cough.   20  capsule   0   . BENZONATATE 200 MG PO CAPS   Oral   Take 1 capsule (200 mg total) by mouth 3 (three) times daily as needed for cough.   30 capsule   1   . HYDROCOD POLST-CPM POLST ER 10-8 MG/5ML PO LQCR   Oral   Take 5 mLs by mouth every 12 (twelve) hours.   115 mL   0   . CYCLOBENZAPRINE HCL 10 MG PO TABS   Oral   Take 10 mg by mouth 3 (three) times daily as needed. For muscle spasms         . GABAPENTIN 300 MG PO CAPS   Oral   Take 300 mg by mouth 3 (three) times daily.         Marland Kitchen GLIPIZIDE 10 MG PO TABS   Oral   Take 20 mg by mouth 2 (two) times daily before a meal.         . HYDROCHLOROTHIAZIDE 25 MG PO TABS   Oral   Take 25 mg by mouth daily.         Marland Kitchen HYDROCODONE-ACETAMINOPHEN 5-325 MG PO TABS   Oral  Take 2 tablets by mouth every 4 (four) hours as needed for pain.   10 tablet   0   . IBUPROFEN 200 MG PO TABS   Oral   Take 1,000 mg by mouth once as needed. For headache         . IBUPROFEN 800 MG PO TABS   Oral   Take 1 tablet (800 mg total) by mouth 3 (three) times daily.   21 tablet   0   . LEVOFLOXACIN 500 MG PO TABS   Oral   Take 1 tablet (500 mg total) by mouth daily.   10 tablet   0   . LEVOTHYROXINE SODIUM 150 MCG PO TABS   Oral   Take 150 mcg by mouth daily.         Marland Kitchen LISINOPRIL 2.5 MG PO TABS   Oral   Take 2.5 mg by mouth daily.         . MELOXICAM 15 MG PO TABS   Oral   Take 15 mg by mouth daily.         Marland Kitchen METFORMIN HCL 1000 MG PO TABS   Oral   Take 1,000 mg by mouth 2 (two) times daily with a meal.         . ADULT MULTIVITAMIN W/MINERALS CH   Oral   Take 1 tablet by mouth daily.         Marland Kitchen HAIR VITAMINS PO   Oral   Take 1 tablet by mouth daily.         Marland Kitchen NAPROXEN 500 MG PO TABS   Oral   Take 1 tablet (500 mg total) by mouth 2 (two) times daily.   14 tablet   0   . PREDNISONE 20 MG PO TABS   Oral   Take 2 tablets (40 mg total) by mouth daily. Take 40 mg by mouth daily for 3 days, then 22m by  mouth daily for 3 days, then 146mdaily for 3 days   12 tablet   0   . SALINE NASAL SPRAY 0.65 % NA SOLN   Nasal   Place 2 sprays into the nose 2 (two) times daily as needed. For nasal congestion           BP 156/80  Pulse 84  Temp 97.9 F (36.6 C) (Oral)  Resp 16  Ht 5' 7"  (1.702 m)  Wt 220 lb (99.791 kg)  BMI 34.46 kg/m2  SpO2 99%  Physical Exam  Nursing note and vitals reviewed. Constitutional: She is oriented to person, place, and time. She appears well-developed and well-nourished.  HENT:  Head: Normocephalic and atraumatic. Head is without abrasion and without laceration.  Right Ear: Tympanic membrane and external ear normal.  Left Ear: Tympanic membrane and external ear normal.  Mouth/Throat: Oropharynx is clear and moist.  Eyes: Conjunctivae normal and EOM are normal. Pupils are equal, round, and reactive to light. Right eye exhibits no discharge. Left eye exhibits no discharge. No scleral icterus. Right eye exhibits no nystagmus. Left eye exhibits no nystagmus.       Accomodation and visual field confrontation normal  Neck: Normal range of motion. Neck supple.  Cardiovascular: Normal rate and regular rhythm.   Pulmonary/Chest: Effort normal and breath sounds normal.  Neurological: She is alert and oriented to person, place, and time. She has normal strength. No cranial nerve deficit or sensory deficit. Coordination and gait normal.  Skin: Skin is warm and dry.    ED Course  Procedures (including critical care time)  Labs Reviewed  C-REACTIVE PROTEIN - Abnormal; Notable for the following:    CRP 0.9 (*)     All other components within normal limits  SEDIMENTATION RATE - Abnormal; Notable for the following:    Sed Rate 61 (*)     All other components within normal limits  CBC WITH DIFFERENTIAL - Abnormal; Notable for the following:    RBC 3.84 (*)     Hemoglobin 10.6 (*)     HCT 32.9 (*)     Platelets 141 (*)     All other components within normal limits   BASIC METABOLIC PANEL - Abnormal; Notable for the following:    Glucose, Bld 210 (*)     All other components within normal limits  URINALYSIS, ROUTINE W REFLEX MICROSCOPIC - Abnormal; Notable for the following:    Hgb urine dipstick SMALL (*)     All other components within normal limits  URINE MICROSCOPIC-ADD ON - Abnormal; Notable for the following:    Squamous Epithelial / LPF FEW (*)     All other components within normal limits   No results found.   1. Tenderness of temple region       MDM  5:01 PM Pt evaluated.  Pain directly over left temple.  Concern for temporal arteritis.  Will get basic labs with CRP and ESR.  7:31 PM ESR elevated at 61.  CRP still pending.  8:13 PM CRP also elevated at 0.9.  Consulted neuro-Dr. Rexene Alberts.  Suggesting checking urine, tapering dose of steroids (60-40-20), and follow-up with her in office.  1st dose of steroid taper given prior to d/c.  Informed that steroids may cause a rise in blood sugars. Instructed to schedule appointment with neuro and return to the ED for any new or worsening symptoms.     Larene Pickett, PA-C 11/08/12 2101

## 2012-11-08 NOTE — ED Notes (Signed)
Pt given cup of water to drink. No further needs.

## 2012-11-08 NOTE — ED Provider Notes (Signed)
Medical screening examination/treatment/procedure(s) were conducted as a shared visit with non-physician practitioner(s) and myself.  I personally evaluated the patient during the encounter   Julianne Rice, MD 11/08/12 2245

## 2013-06-16 ENCOUNTER — Encounter: Payer: Self-pay | Admitting: *Deleted

## 2013-06-19 ENCOUNTER — Ambulatory Visit (INDEPENDENT_AMBULATORY_CARE_PROVIDER_SITE_OTHER): Payer: Self-pay | Admitting: Family Medicine

## 2013-06-19 ENCOUNTER — Encounter: Payer: Self-pay | Admitting: Family Medicine

## 2013-06-19 VITALS — BP 112/78 | HR 88 | Temp 98.7°F | Ht 66.0 in | Wt 221.6 lb

## 2013-06-19 DIAGNOSIS — E785 Hyperlipidemia, unspecified: Secondary | ICD-10-CM

## 2013-06-19 DIAGNOSIS — F529 Unspecified sexual dysfunction not due to a substance or known physiological condition: Secondary | ICD-10-CM

## 2013-06-19 DIAGNOSIS — I10 Essential (primary) hypertension: Secondary | ICD-10-CM | POA: Insufficient documentation

## 2013-06-19 DIAGNOSIS — E039 Hypothyroidism, unspecified: Secondary | ICD-10-CM | POA: Insufficient documentation

## 2013-06-19 DIAGNOSIS — E782 Mixed hyperlipidemia: Secondary | ICD-10-CM | POA: Insufficient documentation

## 2013-06-19 DIAGNOSIS — E119 Type 2 diabetes mellitus without complications: Secondary | ICD-10-CM | POA: Insufficient documentation

## 2013-06-19 LAB — CBC
MCH: 25.2 pg — ABNORMAL LOW (ref 26.0–34.0)
MCHC: 31.6 g/dL (ref 30.0–36.0)
MCV: 79.6 fL (ref 78.0–100.0)
Platelets: 157 10*3/uL (ref 150–400)
RBC: 3.73 MIL/uL — ABNORMAL LOW (ref 3.87–5.11)
RDW: 15.9 % — ABNORMAL HIGH (ref 11.5–15.5)

## 2013-06-19 LAB — HEMOGLOBIN A1C: Hgb A1c MFr Bld: 7.7 % — ABNORMAL HIGH (ref <5.7)

## 2013-06-19 NOTE — Progress Notes (Signed)
Subjective:     Patient ID: Priscilla Roberts, female   DOB: 25-Mar-1966, 47 y.o.   MRN: [id number]  HPI Priscilla Roberts is a 47 y.o. G1P1 here for evaluation of decrease libido over last 12 months. Able to have intercourse without pain. Pt able to engage and have orgasm but low drive to have intercourse. Pt has not had period in 25 years but reports having hot flashes now. Pt on numerous meds for Diabetes, HTN, HLD, HypoTH and pain.  No headches, no vision change, no chest pain, no change in weight, no urinary sx or bowel problems. No hirsuitism but pt reports hair loss. Pt reports decreased energy. Deneis depression.  Review of Systems See HPI    Objective:   Physical Exam  Constitutional: She is oriented to person, place, and time. She appears well-developed and well-nourished. No distress.  HENT:  Head: Normocephalic and atraumatic.  Eyes: Conjunctivae and EOM are normal.  Neck: Normal range of motion. Neck supple. No tracheal deviation present. No thyromegaly present.  Cardiovascular: Normal rate, regular rhythm, normal heart sounds and intact distal pulses.  Exam reveals no gallop and no friction rub.   No murmur heard. Pulmonary/Chest: Effort normal and breath sounds normal. No respiratory distress. She has no wheezes. She has no rales. She exhibits no tenderness.  Abdominal: Soft. Bowel sounds are normal. She exhibits no distension and no mass. There is no tenderness. There is no rebound and no guarding.  Musculoskeletal: Normal range of motion.  Neurological: She is alert and oriented to person, place, and time. No cranial nerve deficit.  Skin: Skin is warm and dry. She is not diaphoretic.  Psychiatric: She has a normal mood and affect. Her behavior is normal. Judgment and thought content normal.       Assessment:     1) Sexual Dysfunction     Plan:     1) -Check TSH, Hgb A1c, cbc, Prolactin levels. Address endocrine abnormalities appropriately.     -Patient encouraged /  educated on benefits of exercise and positive psychology.     -F/u 4 weeks to reevaluate     -Consider counseling and/or medication changes if aforementioned therapy unsuccessful.      Fredrik Rigger, MD OB Fellow

## 2013-06-20 LAB — TSH: TSH: 0.022 u[IU]/mL — ABNORMAL LOW (ref 0.350–4.500)

## 2013-06-24 ENCOUNTER — Telehealth: Payer: Self-pay | Admitting: General Practice

## 2013-06-24 NOTE — Telephone Encounter (Signed)
Called patient and informed of results and recommendations. Patient stated she was in between primary care doctors at the moment. Told patient when she gets settled with one she can come by our office to get her records transferred so they will have her lab work. Patient verbalized understanding and had no further questions

## 2013-06-24 NOTE — Telephone Encounter (Signed)
Message copied by Shelly Coss on Tue Jun 24, 2013 12:01 PM ------      Message from: Leamon Arnt R      Created: Sun Jun 22, 2013  3:45 PM       Good Morning      Please call pt and tell her that her decreased libido is likely related to a number of factors seen on her labs. Pt needs to see her PCM for her thyroid. 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 Her sugars are elevated (a1c 7.7) and her management likely needs to be adjusted as well. She is also anemic and may benefit from iron. She should also have her PCM look into her anemia.            Once these items are addressed, she may have improvement in her libido. I would continue to encourage exercise and postive mental image as well.             Thank you.       MRO ------

## 2013-10-04 IMAGING — CR DG HAND COMPLETE 3+V*R*
3 series · 3 of 3 positions shown · non-contrast
Comparison: None.

CLINICAL DATA: Right thumb pain.

RIGHT HAND - COMPLETE 3+ VIEW

[x hand pa right]
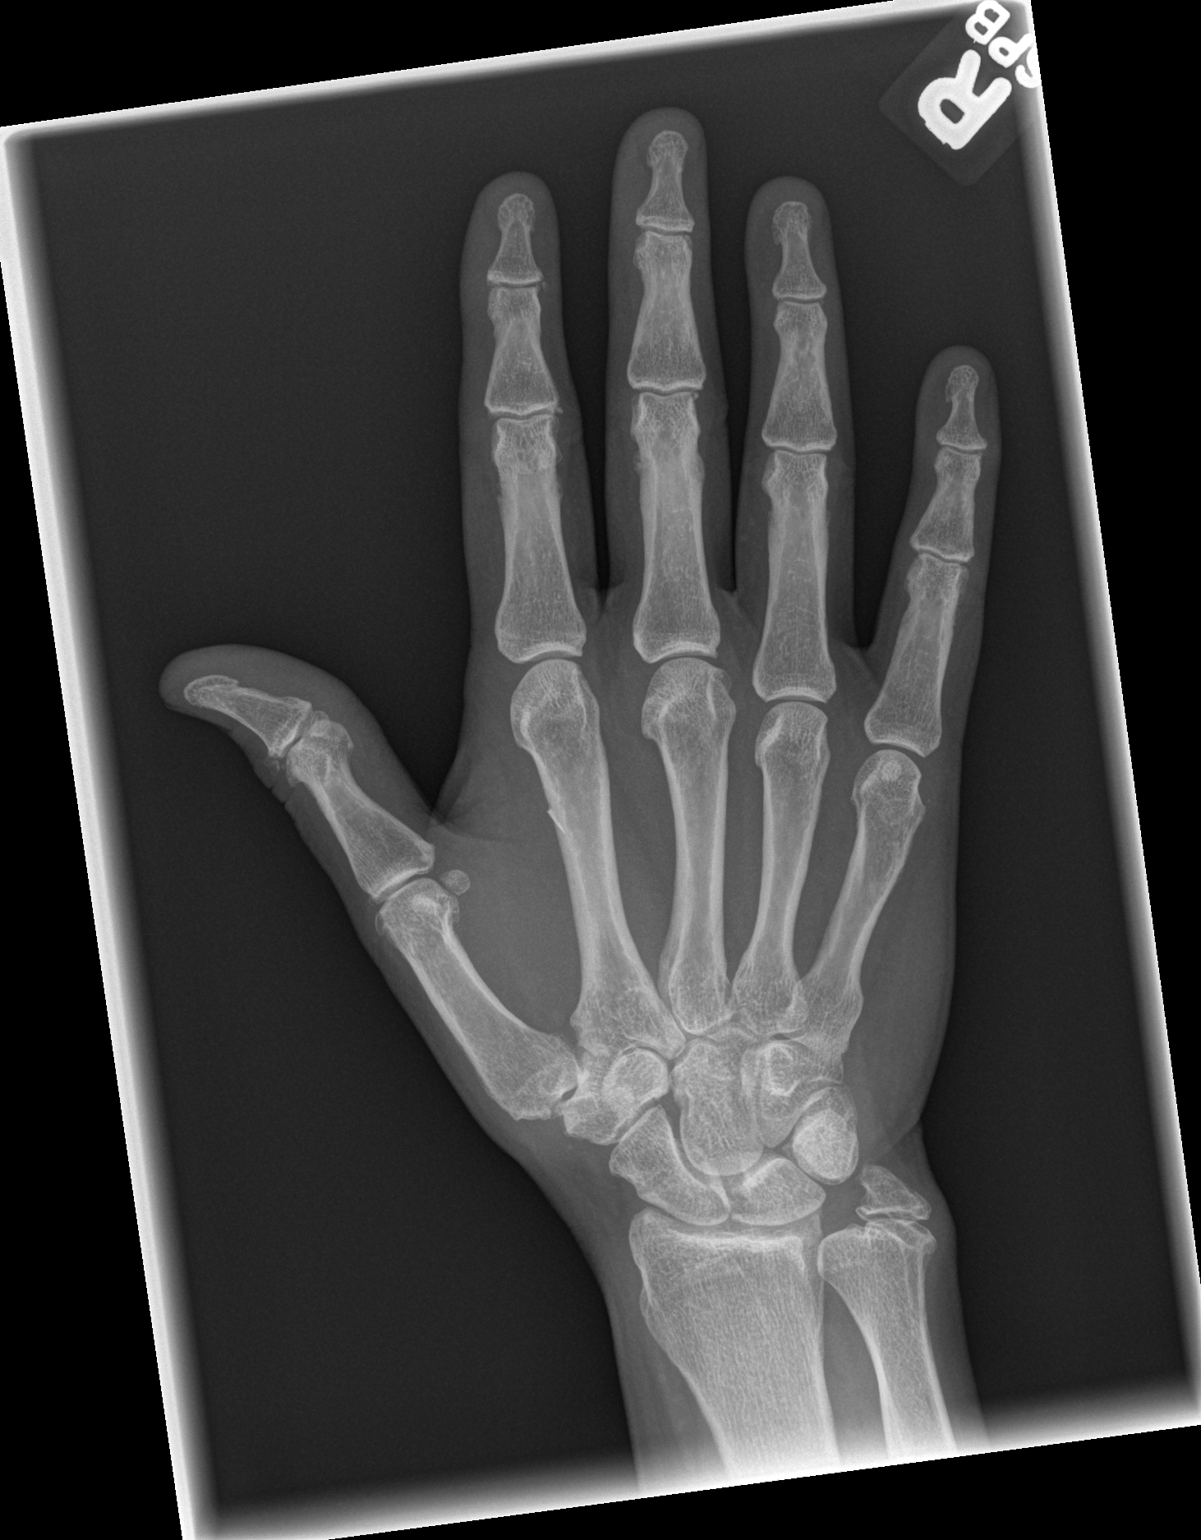

[x hand oblique right]
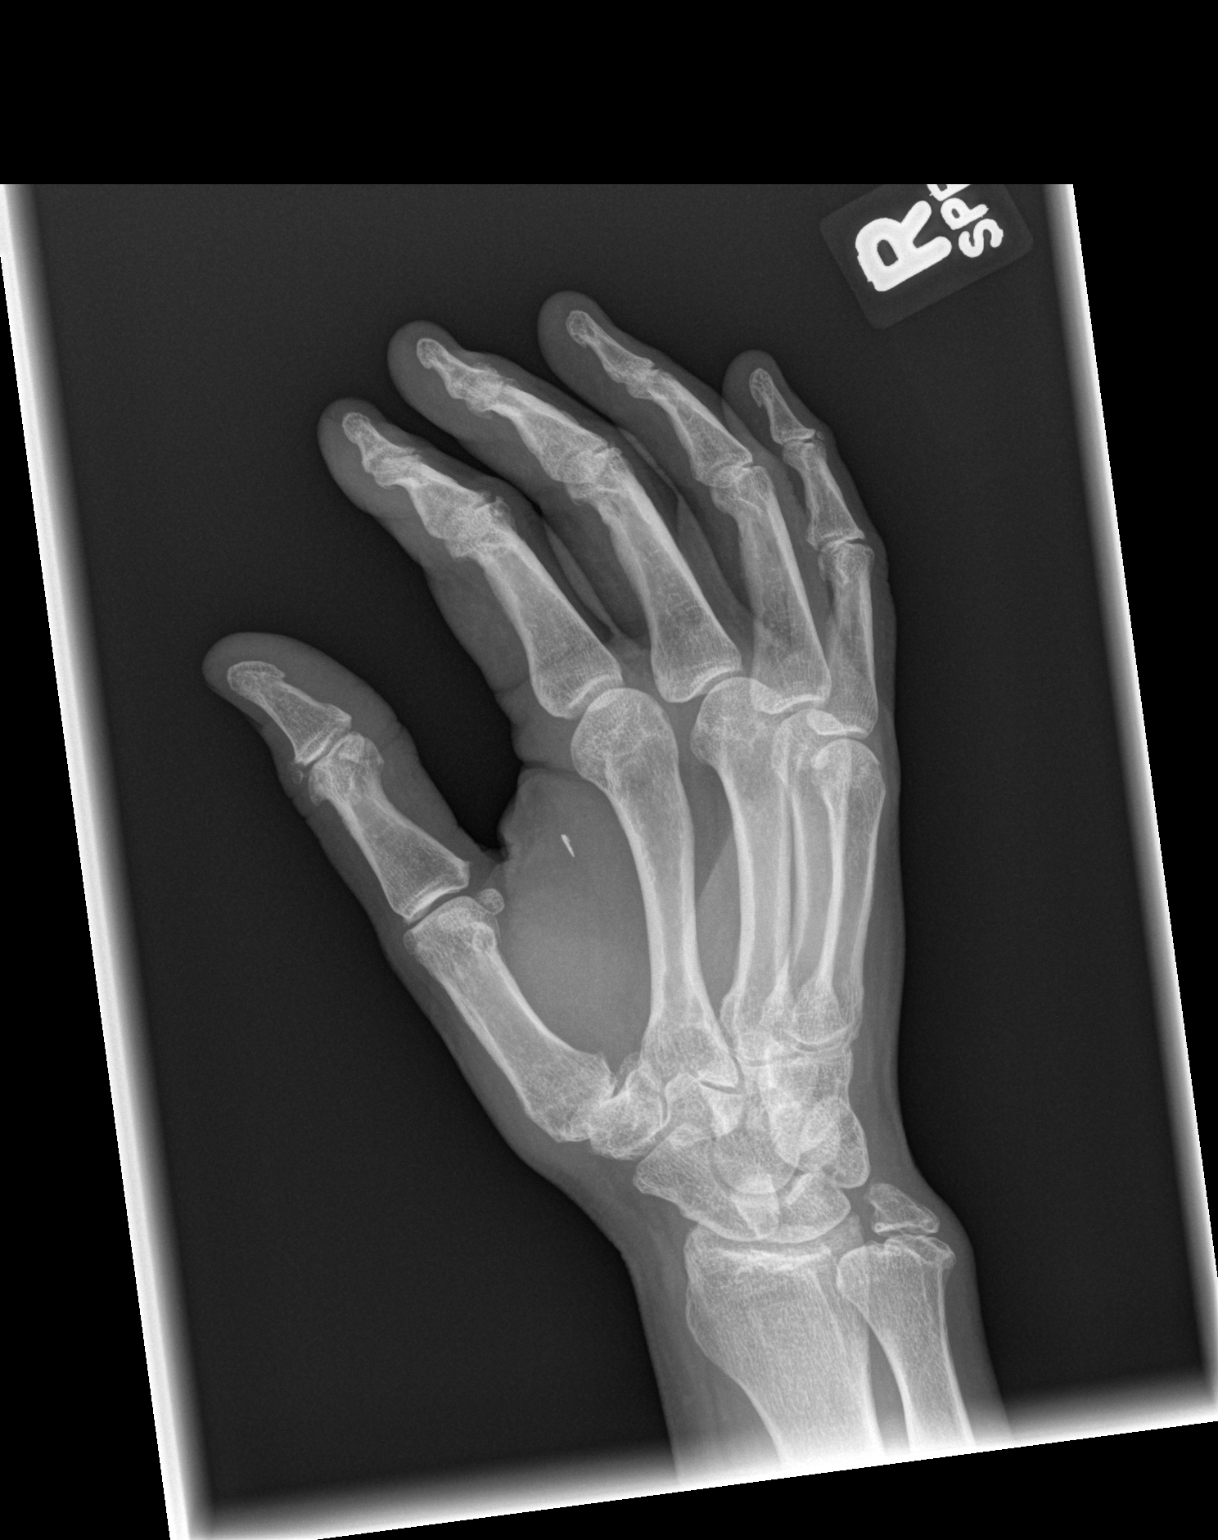

[x hand lat right]
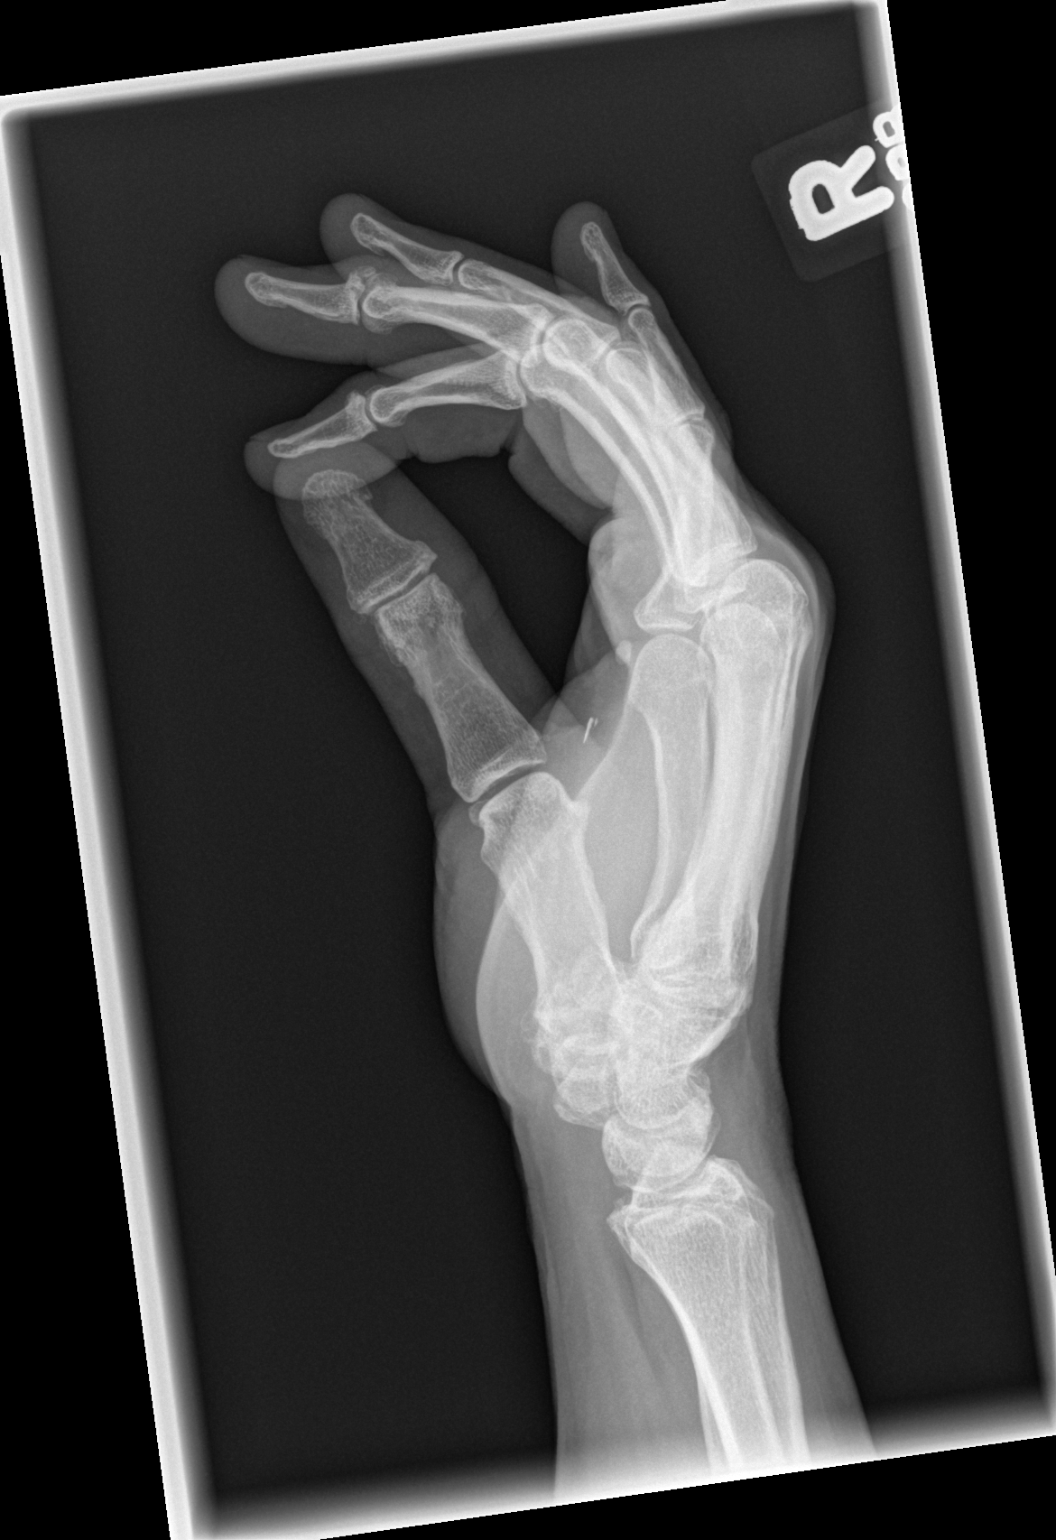

[3 of 3 positions shown; findings below may reference images not displayed]

FINDINGS: Three views of the right hand were obtained.  There is a
5 mm linear density and a smaller adjacent radiopaque structure
within the palmar soft tissues overlying the second metacarpal
bone.  Findings are consistent with small foreign bodies.  There
are degenerative changes at the first carpometacarpal joint.  Old
injury or incomplete ossification of the ulnar styloid.
Degenerative changes involving the middle finger DIP joint.  Mild
degenerative changes involving the thumb MCP joint.
IMPRESSION: Osteoarthritis as described.  No acute bony abnormality.

Two small foreign bodies in the soft tissues overlying the second
metacarpal bone.

## 2013-12-15 IMAGING — CR DG CHEST 2V
2 series · 2 of 2 positions shown · non-contrast
Comparison: 09/30/2012

CLINICAL DATA: 46 year old female with cough and congestion.

CHEST - 2 VIEW

[view not recorded (1 of 2)]
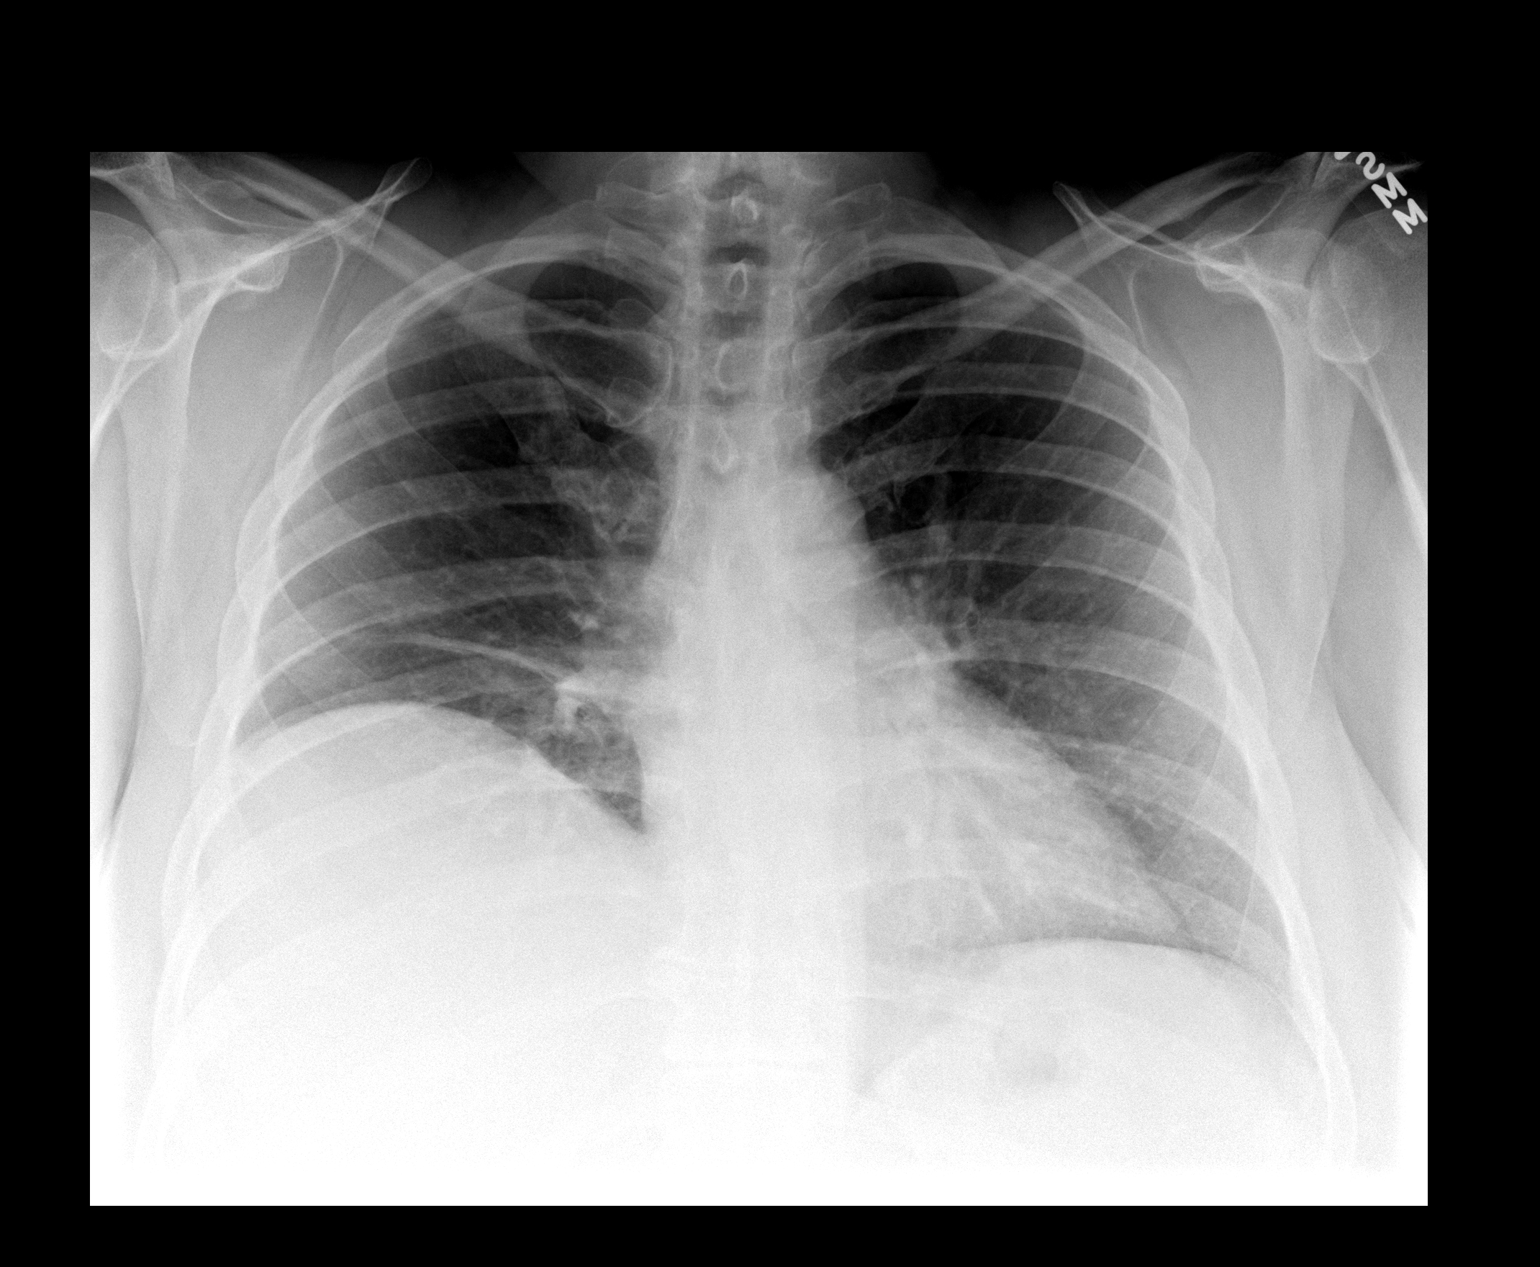

[view not recorded (2 of 2)]
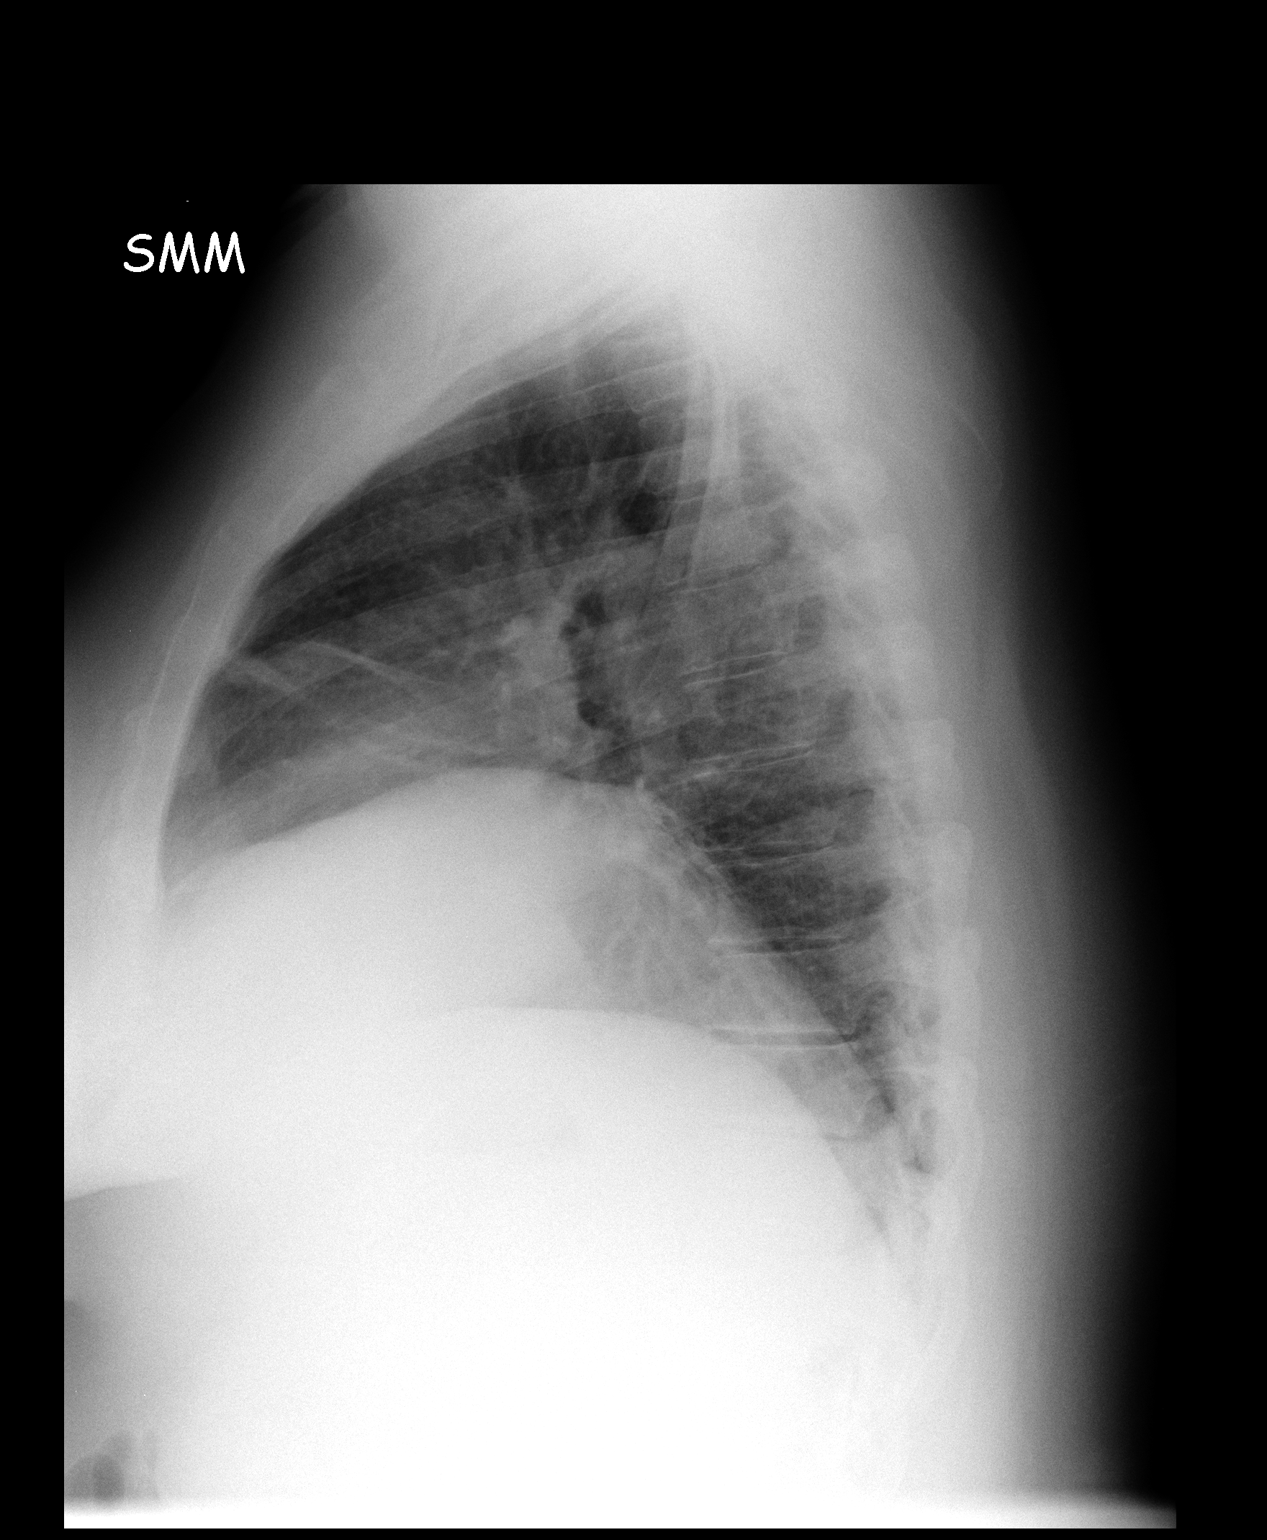

[2 of 2 positions shown; findings below may reference images not displayed]

FINDINGS: The cardiomediastinal silhouette is stable.
Right basilar atelectasis/scarring and elevation of the right
hemidiaphragm again noted.
Mild peribronchial thickening is unchanged.
There is no evidence of focal airspace disease, pulmonary edema,
suspicious pulmonary nodule/mass, pleural effusion, or
pneumothorax.
No acute bony abnormalities are identified.
IMPRESSION: No evidence of acute cardiopulmonary disease.

Mild peribronchial thickening without focal pneumonia.

Continued right basilar atelectasis/scarring and right
hemidiaphragm elevation.

## 2021-03-17 ENCOUNTER — Encounter: Payer: Self-pay | Admitting: Orthopaedic Surgery

## 2021-03-17 ENCOUNTER — Other Ambulatory Visit: Payer: Self-pay

## 2021-03-17 ENCOUNTER — Ambulatory Visit (INDEPENDENT_AMBULATORY_CARE_PROVIDER_SITE_OTHER): Payer: Medicare HMO | Admitting: Orthopaedic Surgery

## 2021-03-17 ENCOUNTER — Ambulatory Visit (INDEPENDENT_AMBULATORY_CARE_PROVIDER_SITE_OTHER): Payer: Medicare HMO

## 2021-03-17 VITALS — Ht 65.0 in | Wt 228.0 lb

## 2021-03-17 DIAGNOSIS — G8929 Other chronic pain: Secondary | ICD-10-CM

## 2021-03-17 DIAGNOSIS — M545 Low back pain, unspecified: Secondary | ICD-10-CM
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
## 2021-03-24 ENCOUNTER — Other Ambulatory Visit: Payer: Self-pay

## 2021-03-24 ENCOUNTER — Ambulatory Visit (INDEPENDENT_AMBULATORY_CARE_PROVIDER_SITE_OTHER): Payer: Medicare HMO | Admitting: Orthopaedic Surgery

## 2021-03-24 ENCOUNTER — Encounter: Payer: Self-pay | Admitting: Orthopaedic Surgery

## 2021-03-24 VITALS — Ht 65.0 in | Wt 228.0 lb

## 2021-03-24 DIAGNOSIS — G8929 Other chronic pain: Secondary | ICD-10-CM

## 2021-03-24 DIAGNOSIS — M545 Low back pain, unspecified: Secondary | ICD-10-CM | POA: Diagnosis not present

## 2021-03-24 DIAGNOSIS — M47816 Spondylosis without myelopathy or radiculopathy, lumbar region: Secondary | ICD-10-CM | POA: Diagnosis not present

## 2021-03-24 NOTE — Progress Notes (Signed)
Office Visit Note   Patient: Priscilla Roberts           Date of Birth: 03-Jul-1966           MRN: [id number] Visit Date: 03/24/2021              Requested by: Leeanne Rio, MD [place],  [place] [address] PCP: Leeanne Rio, MD   Assessment & Plan: Visit Diagnoses:  1. Chronic bilateral low back pain, unspecified whether sciatica present   2. Facet degeneration of lumbar region     Plan: Patient does have some facet arthropathy at L4-5 and L5-S1.  Unchanged this protrusions in the thoracic spine.  She does have 1 mm anterolisthesis at L4-5 but no instability on flexion-extension radiographs that were previously obtained.  We will set her up for some physical therapy and see her in follow-up of this not effective we can consider facet injections if her severe pain persist.  We discussed past pain medications including last year when she has been on pain patches, subtle sublingual films, hydrocodone tablets and past monitoring and pain clinic.  I discussed with her that the MRI scan shows some arthritis in her back but no severe nerve compression and continued narcotic medication usage is not appropriate for her current condition.  We discussed problems with pain tolerance, hyperalgesia, hypersensitivity to pain that can develop.  Recheck after physical therapy.  Follow-Up Instructions: Return in about 5 weeks (around 04/28/2021).   Orders:  Orders Placed This Encounter  Procedures   Ambulatory referral to Physical Therapy   No orders of the defined types were placed in this encounter.     Procedures: No procedures performed   Clinical Data: No additional findings.   Subjective: Chief Complaint  Patient presents with   Lower Back - Pain, Follow-up    MRI review    HPI 55 year old female returns with ongoing severe back pain that she rates as severe with pain with standing pain with walking turning twisting.  She states her pain again is greater than 10.   MRI scan has been obtained which shows no central foraminal or lateral recess stenosis.  She does have facet arthropathy at L4-5 and L5-S1.  Patient has diabetes type 2 with neuropathy.  She states she still can only stand 5 to 10 minutes and then has to sit down.  She still goes to the grocery store.  She has been on prednisone Dosepak Lyrica Mobic home exercise program without relief.  Review of Systems 14 point update unchanged from 03/17/2021 office visit.   Objective: Vital Signs: Ht 5' 5"  (1.651 m)   Wt 228 lb (103.4 kg)   BMI 37.94 kg/m   Physical Exam Constitutional:      Appearance: She is well-developed.  HENT:     Head: Normocephalic.     Right Ear: External ear normal.     Left Ear: External ear normal. There is no impacted cerumen.  Eyes:     Pupils: Pupils are equal, round, and reactive to light.  Neck:     Thyroid: No thyromegaly.     Trachea: No tracheal deviation.  Cardiovascular:     Rate and Rhythm: Normal rate.  Pulmonary:     Effort: Pulmonary effort is normal.  Abdominal:     Palpations: Abdomen is soft.  Musculoskeletal:     Cervical back: No rigidity.  Skin:    General: Skin is warm and dry.  Neurological:  Mental Status: She is alert and oriented to person, place, and time.  Psychiatric:        Behavior: Behavior normal.    Ortho Exam patient has pain is fairly raising 80 degrees both right and left negative logroll the hips.  Knee and ankle jerk are intact.  Pedal pulses are palpable right and left.  Knees reach full extension.  Negative popliteal compression test.  Specialty Comments:  No specialty comments available.  Imaging:Impression  1. No acute lumbar spine findings, spinal stenosis or nerve root  encroachment.  2. Moderate facet degenerative changes at L4-5 and L5-S1 may  contribute to back pain.  3. Lower thoracic spondylosis and small disc protrusions are similar  to the previous thoracic MRI from 2020.    Electronically  Signed    By: Richardean Sale M.D.    On: 03/22/2021 13:20  Narrative  CLINICAL DATA:  Low back pain, unspecified laterality and  chronicity. Worsening pain for 3-4 years with occasional radiation  to the right leg. No acute injury or prior relevant surgery. Remote  cervical cancer.   EXAM:  MRI LUMBAR SPINE WITHOUT CONTRAST   TECHNIQUE:  Multiplanar, multisequence MR imaging of the lumbar spine was  performed. No intravenous contrast was administered.   COMPARISON:  Radiographs 02/21/2021. Thoracic spine MRI 09/23/2019  and abdominopelvic CT 04/19/2015   FINDINGS:  Segmentation: Conventional anatomy assumed, with the last open disc  space designated L5-S1.Concordant with previous imaging.   Alignment:  Physiologic.   Vertebrae: No worrisome osseous lesion, acute fracture or pars  defect. Mild sacroiliac degenerative changes bilaterally.   Conus medullaris: Extends to the T12 level and appears normal.   Paraspinal and other soft tissues: No significant paraspinal  findings.   Disc levels:   T11-12: Stable disc and endplate degeneration with a chronic left  paracentral disc protrusion.   T12-L1: Stable disc and endplate degeneration with annular bulging  and a small central disc protrusion. No spinal stenosis or nerve  root encroachment.   L1-2: Loss of disc height with mild annular disc bulging. No spinal  stenosis or nerve root encroachment.   L2-3: Mild disc bulging and facet hypertrophy. No spinal stenosis or  nerve root encroachment.   L3-4: Mild disc bulging and facet hypertrophy. No spinal stenosis or  nerve root encroachment.   L4-5: Mild disc bulging with moderate facet and ligamentous  hypertrophy. No spinal stenosis or nerve root encroachment.   L5-S1: Mild disc bulging with small central disc protrusion and  moderate bilateral facet hypertrophy. No spinal stenosis or nerve  root encroachment.  Procedure Note  Whitman Hero, MD -  03/22/2021  Formatting of this note might be different from the original.  CLINICAL DATA:  Low back pain, unspecified laterality and  chronicity. Worsening pain for 3-4 years with occasional radiation  to the right leg. No acute injury or prior relevant surgery. Remote  cervical cancer.      PMFS History: Patient Active Problem List   Diagnosis Date Noted   Facet degeneration of lumbar region 03/28/2021   HLD (hyperlipidemia) 06/19/2013   HTN (hypertension) 06/19/2013   Diabetes mellitus ([place]) 06/19/2013   Hypothyroid 06/19/2013   Past Medical History:  Diagnosis Date   Arthritis    Cancer ([place])    Diabetes mellitus without complication ([place])    High cholesterol    Hypertension    Thyroid disease     No family history on file.  Past Surgical History:  Procedure Laterality Date  ABDOMINAL HYSTERECTOMY     JOINT REPLACEMENT Bilateral    bilateral thumb joint replacement   THUMB ARTHROSCOPY     Social History   Occupational History   Not on file  Tobacco Use   Smoking status: Former    Pack years: 0.00   Smokeless tobacco: Never  Substance and Sexual Activity   Alcohol use: No   Drug use: No   Sexual activity: Yes    Birth control/protection: Surgical

## 2021-03-28 DIAGNOSIS — M47816 Spondylosis without myelopathy or radiculopathy, lumbar region: Secondary | ICD-10-CM | POA: Insufficient documentation

## 2021-04-11 ENCOUNTER — Encounter: Payer: Self-pay | Admitting: Podiatry

## 2021-04-11 ENCOUNTER — Other Ambulatory Visit: Payer: Self-pay

## 2021-04-11 ENCOUNTER — Ambulatory Visit: Payer: Medicare HMO | Admitting: Podiatry

## 2021-04-11 DIAGNOSIS — E1142 Type 2 diabetes mellitus with diabetic polyneuropathy: Secondary | ICD-10-CM | POA: Diagnosis not present

## 2021-04-11 DIAGNOSIS — L84 Corns and callosities: Secondary | ICD-10-CM | POA: Insufficient documentation

## 2021-04-11 DIAGNOSIS — E1140 Type 2 diabetes mellitus with diabetic neuropathy, unspecified: Secondary | ICD-10-CM | POA: Insufficient documentation

## 2021-04-11 NOTE — Progress Notes (Signed)
This patient presents  to my office for at risk foot care.  This patient requires this care by a professional since this patient will be at risk due to having diabetic neuropathy.  Patient states that she is diabetic and has been taking gabapentin and Lyrica for her neuropathy.  She says she has received a pair of diabetic shoes but she is unable to wear due to a prominence on the top of her left foot.  She says this is painful when she puts on her shoes.  This patient is unable to cut callus herself since the patient cannot reach her feet..These callus are painful walking and wearing shoes.  This patient presents for at risk foot care today.  General Appearance  Alert, conversant and in no acute stress.  Vascular  Dorsalis pedis and posterior tibial  pulses are palpable  bilaterally.  Capillary return is within normal limits  bilaterally. Temperature is within normal limits  bilaterally.  Neurologic  Senn-Weinstein monofilament wire test absent  bilaterally. Muscle power within normal limits bilaterally.  Nails Normal nails noted. No evidence of bacterial infection or drainage bilaterally.  Orthopedic  No limitations of motion  feet .  No crepitus or effusions noted.  No bony pathology or digital deformities noted.  Midfoot DJD left foot.  Mild  HAV  B/L.   Skin  normotropic skin with no porokeratosis noted bilaterally.  No signs of infections or ulcers noted.   Callus sub 1,5  B/L.  Callus both feet   Consent was obtained for treatment procedures.   Debridement of callus  B/L.   Filed with dremel without incident.  Discussed diabetic shoes with this patient.  Told her to bring her old diabetic shoes into the [facility] and have them put a cut out in the insoles and the diabetic shoes.  She is also to consider purchasing a new pair of diabetic shoes at this visit  Diabetic foot exam performed reveal neuropathy but vascular  WNL.  Patient qualifies for diabetic shoes due to DPN and HAV   B/L.   Return office visit   1 year                  Told patient to return for periodic foot care and evaluation due to potential at risk complications.   Gardiner Barefoot DPM

## 2021-04-18 ENCOUNTER — Encounter (INDEPENDENT_AMBULATORY_CARE_PROVIDER_SITE_OTHER): Payer: Self-pay | Admitting: *Deleted

## 2021-04-26 ENCOUNTER — Telehealth: Payer: Self-pay | Admitting: *Deleted

## 2021-04-26 ENCOUNTER — Other Ambulatory Visit: Payer: Medicare HMO

## 2021-04-26 ENCOUNTER — Other Ambulatory Visit: Payer: Self-pay

## 2021-04-26 NOTE — Telephone Encounter (Signed)
Patient has been measured for inserts/diabetics today-04/26/21.

## 2021-04-28 ENCOUNTER — Ambulatory Visit: Payer: Medicare HMO | Admitting: Orthopaedic Surgery

## 2021-06-23 ENCOUNTER — Other Ambulatory Visit: Payer: Self-pay

## 2021-06-23 ENCOUNTER — Ambulatory Visit (INDEPENDENT_AMBULATORY_CARE_PROVIDER_SITE_OTHER): Payer: Medicare HMO

## 2021-06-23 DIAGNOSIS — E1142 Type 2 diabetes mellitus with diabetic polyneuropathy: Secondary | ICD-10-CM

## 2021-06-23 DIAGNOSIS — M2011 Hallux valgus (acquired), right foot: Secondary | ICD-10-CM | POA: Diagnosis not present

## 2021-06-23 DIAGNOSIS — M2012 Hallux valgus (acquired), left foot: Secondary | ICD-10-CM | POA: Diagnosis not present

## 2021-06-23 NOTE — Progress Notes (Signed)
Patient in office today to pick-up diabetic shoes and custom inserts. Patient tried on the shoes with the custom inserts and was Not satisfied with the fit and feel of the shoe.Shoes were too small. Patient was measured today and will need a size 9 wide women's shoe ordered in the [facility]. Patient took inserts today and advised to bring them back  when new shoes arrive.  Patient was educated on the break-in process and return policy at this time. Patient verbalized understanding using the teach back method. Advised patient to call the office with any questions, comments, or concerns.

## 2021-07-08 ENCOUNTER — Telehealth: Payer: Self-pay | Admitting: Podiatry

## 2021-07-08 NOTE — Telephone Encounter (Signed)
Reordered diabetic shoes in... lvm for pt to call to schedule an appt to pick them up.

## 2021-07-14 ENCOUNTER — Other Ambulatory Visit: Payer: Self-pay

## 2021-07-14 ENCOUNTER — Ambulatory Visit (INDEPENDENT_AMBULATORY_CARE_PROVIDER_SITE_OTHER): Payer: Medicare HMO | Admitting: *Deleted

## 2021-07-14 DIAGNOSIS — L84 Corns and callosities: Secondary | ICD-10-CM | POA: Diagnosis not present

## 2021-07-14 DIAGNOSIS — E1142 Type 2 diabetes mellitus with diabetic polyneuropathy: Secondary | ICD-10-CM

## 2021-07-14 DIAGNOSIS — M2042 Other hammer toe(s) (acquired), left foot: Secondary | ICD-10-CM | POA: Diagnosis not present

## 2021-07-14 DIAGNOSIS — M2041 Other hammer toe(s) (acquired), right foot: Secondary | ICD-10-CM

## 2021-07-14 NOTE — Progress Notes (Signed)
Patient presents today to pick up reordered diabetic shoes.  Patient was dispensed 1 pair of diabetic shoes. Fit was satisfactory. Instructions for break-in and wear was reviewed.  Patient left her insoles at home. Advised to make sure she took original insoles out of shoes before placing her custom ones in.  Re-appointment for regularly scheduled diabetic foot care visits or if they should experience any trouble with the shoes or insoles.   ~Insoles billed initially, shoes will be billed today~

## 2021-08-22 ENCOUNTER — Ambulatory Visit (INDEPENDENT_AMBULATORY_CARE_PROVIDER_SITE_OTHER): Payer: Medicare HMO | Admitting: Gastroenterology

## 2021-08-22 ENCOUNTER — Encounter (INDEPENDENT_AMBULATORY_CARE_PROVIDER_SITE_OTHER): Payer: Self-pay | Admitting: Gastroenterology

## 2021-08-23 ENCOUNTER — Encounter (INDEPENDENT_AMBULATORY_CARE_PROVIDER_SITE_OTHER): Payer: Self-pay | Admitting: *Deleted

## 2021-09-06 ENCOUNTER — Ambulatory Visit (INDEPENDENT_AMBULATORY_CARE_PROVIDER_SITE_OTHER): Payer: Medicare HMO | Admitting: Gastroenterology

## 2021-09-07 ENCOUNTER — Ambulatory Visit (INDEPENDENT_AMBULATORY_CARE_PROVIDER_SITE_OTHER): Payer: Medicare HMO | Admitting: Gastroenterology

## 2021-09-12 ENCOUNTER — Encounter (INDEPENDENT_AMBULATORY_CARE_PROVIDER_SITE_OTHER): Payer: Self-pay | Admitting: *Deleted

## 2021-09-12 ENCOUNTER — Ambulatory Visit (INDEPENDENT_AMBULATORY_CARE_PROVIDER_SITE_OTHER): Payer: Medicare HMO | Admitting: Gastroenterology

## 2021-09-19 ENCOUNTER — Encounter (INDEPENDENT_AMBULATORY_CARE_PROVIDER_SITE_OTHER): Payer: Self-pay

## 2021-09-19 ENCOUNTER — Other Ambulatory Visit: Payer: Self-pay

## 2021-09-19 ENCOUNTER — Telehealth (INDEPENDENT_AMBULATORY_CARE_PROVIDER_SITE_OTHER): Payer: Self-pay

## 2021-09-19 ENCOUNTER — Other Ambulatory Visit (INDEPENDENT_AMBULATORY_CARE_PROVIDER_SITE_OTHER): Payer: Self-pay

## 2021-09-19 ENCOUNTER — Encounter (INDEPENDENT_AMBULATORY_CARE_PROVIDER_SITE_OTHER): Payer: Self-pay | Admitting: Gastroenterology

## 2021-09-19 ENCOUNTER — Ambulatory Visit (INDEPENDENT_AMBULATORY_CARE_PROVIDER_SITE_OTHER): Payer: Medicare HMO | Admitting: Gastroenterology

## 2021-09-19 VITALS — BP 97/65 | HR 72 | Temp 97.8°F | Ht 65.0 in | Wt 228.0 lb

## 2021-09-19 DIAGNOSIS — K7460 Unspecified cirrhosis of liver: Secondary | ICD-10-CM | POA: Insufficient documentation

## 2021-09-19 DIAGNOSIS — Z8719 Personal history of other diseases of the digestive system: Secondary | ICD-10-CM

## 2021-09-19 DIAGNOSIS — K921 Melena: Secondary | ICD-10-CM | POA: Diagnosis not present

## 2021-09-19 DIAGNOSIS — K7581 Nonalcoholic steatohepatitis (NASH): Secondary | ICD-10-CM

## 2021-09-19 DIAGNOSIS — K625 Hemorrhage of anus and rectum: Secondary | ICD-10-CM

## 2021-09-19 MED ORDER — PEG 3350-KCL-NA BICARB-NACL 420 G PO SOLR: 4000.0000 mL | ORAL | 0 refills | Status: DC

## 2021-09-19 NOTE — Telephone Encounter (Signed)
Priscilla Roberts, CMA  

## 2021-09-19 NOTE — Progress Notes (Signed)
Referring Provider: Leeanne Rio, MD Primary Care Physician:  Leeanne Rio, MD Primary GI Physician: newly established  Chief Complaint  Patient presents with   Cirrhosis    Follow up on Cirrhosis and esophageal varices.    HPI:   Priscilla Roberts is a 55 y.o. female with past medical history of arthritis, DM, high cholesterol, HTN, thyroid disease, NASH cirrhosis, esophageal varices, gastric antral vascular ectasia.   Patient presenting today as a new patient for Cirrhosis with history of esophageal varices.  Patient reports she was diagnosed with NASH approx 11 years ago, she had an episode of variceal bleeding in 2016 that required hospitalization at [facility], notably, 05/27/20, she had negative TTG and IgA, non reactive acute hepatitis serologies, normal serum copper level, normal ceruloplasmin, normal AMA, negative ASMA, and mildly Positive ANA of 1 in 160 speckled pattern. Low suspicion of Autoimmune hepatitis at that time so patient was not started on steroids. Last liver US was 06/25/20 with heterogenous liver with cirrhotic morphology, patent portal vein, splenomegaly, gallstones without evidence of acute cholecystitis and no focal liver lesions or ascites. EGD in 2016 with esophageal variceal banding, last EGD was done in 2018. Last LFTs 07/06/21 with AST 25, ALT 30 Alk Phos 62 T bili 0.4 albumin 3.8, Plt count 59, no recent INR, Ferritin at that time was 36.6.  She is currently maintained on aldactone 24m BID, she is not on a beta blocker but takes lisinopril, HR 72 and BP 97/65 today, she does not check her BP at home. She denies any episodes of hematemesis. She reports that she has occasional rectal bleeding and melena, this occurs once every few months. She reports that it has been ongoing since maybe 2018. She has been on iron supplements for the past few years. She does report some easy bruising, she had a recent fall and has bruising to her Left arm, leg and chest. Platelet  count was 59 in October. She denies any episodes of confusion. She denies any issues with swelling to her abdomen. she does have some issues with swelling to her ankles, usually noting a line around her ankle after wearing socks, but no significant edema. She denies any episodes of jaundice or pruritus. She has never had any hospitalizations for HE or history of SBP that she is aware of. She has occasional shortness of breath, but denies any syncopal episodes or dizziness.   NSAID use: occasional use of ibuprofen Social hx: no alcohol or tobacco use Fam hx: father had unknown type of cancer.   Last Colonoscopy:10/31/16, Dr. BRolm Bookbinder Esophagus: The GE junction and Z-line were at 40 cm. Scarring in the lower esophagus, no varices seen. 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 Duodenum: The pylorus was patent. The first and second part of the duodenum were examined and appeared normal.  Last Endoscopy:10/31/16, Dr. BKallie Lockspolyps in the TC , one was removed with cold snare (4381m and the other with cold forceps (81m24mMedium internal hemorrhoids seen on retroflexion.  She was recommended to have repeat colonoscopy in 3 years with 2 day prep.   Past Medical History:  Diagnosis Date   Arthritis    Cancer ([facility]  Diabetes mellitus without complication ([facility]  High cholesterol    Hypertension    Thyroid disease     Past Surgical History:  Procedure Laterality Date   ABDOMINAL HYSTERECTOMY     JOINT REPLACEMENT Bilateral    bilateral thumb joint replacement   THUMB ARTHROSCOPY      Current Outpatient Medications  Medication Sig Dispense Refill   ascorbic acid (VITAMIN C) 500 MG tablet Take by mouth.     Cholecalciferol 50 MCG (2000 UT) CAPS Take by mouth.     cyanocobalamin 1000 MCG tablet Take by mouth.     ferrous sulfate 325 (65 FE) MG  tablet Take by mouth. One daily     folic acid (FOLVITE) 1 MG tablet Take 1 mg by mouth daily.     Garlic 6761 MG CAPS Take by mouth. One qam     HYDROcodone-acetaminophen (NORCO/VICODIN) 5-325 MG per tablet Take 2 tablets by mouth every 4 (four) hours as needed for pain. 10 tablet 0   Levothyroxine Sodium 100 MCG/ML SOLN Take 100 mcg by mouth daily.     lisinopril (PRINIVIL,ZESTRIL) 2.5 MG tablet Take 2.5 mg by mouth daily.     metFORMIN (GLUCOPHAGE) 1000 MG tablet Take 1,000 mg by mouth 2 (two) times daily with a meal.     Multiple Vitamin (MULTIVITAMIN WITH MINERALS) TABS Take 1 tablet by mouth daily.     pantoprazole (PROTONIX) 40 MG tablet Take 1 tablet by mouth 2 (two) times daily.     PARoxetine (PAXIL) 10 MG tablet Take by mouth. One qam     pregabalin (LYRICA) 300 MG capsule Take by mouth 2 (two) times daily.     propranolol (INDERAL) 40 MG tablet Take 1 tablet by mouth 2 (two) times daily.     rosuvastatin (CRESTOR) 5 MG tablet Take 1 tablet by mouth every evening.     spironolactone (ALDACTONE) 25 MG tablet Take 25 mg by mouth 2 (two) times daily.     topiramate (TOPAMAX) 50 MG tablet Take 50 mg by mouth 2 (two) times daily.     zinc gluconate 50 MG tablet Take by mouth daily.     No current facility-administered medications for this visit.    Allergies as of 09/19/2021 - Review Complete 09/19/2021  Allergen Reaction Noted   Prednisone  09/14/2016   Tizanidine  09/14/2016   Chocolate flavor  03/16/2015   Lorazepam  07/05/2015   Other Rash 03/10/2015   Tramadol Other (See Comments) 05/25/2012    History reviewed. No pertinent family history.  Social History   Socioeconomic History   Marital status: Married    Spouse name: Not on file   Number of children: Not on file   Years of education: Not on file   Highest education level: Not on file  Occupational History   Not on file  Tobacco Use   Smoking status: Former   Smokeless tobacco: Never  Substance and Sexual  Activity   Alcohol use: No   Drug use: No   Sexual activity: Yes    Birth control/protection: Surgical  Other Topics Concern   Not on file  Social History Narrative   Not on file   Social Determinants of Health   Financial Resource Strain: Not on file  Food Insecurity: Not on file  Transportation Needs: Not on file  Physical Activity: Not on file  Stress: Not on file  Social Connections: Not on file   Review of systems General: negative for malaise, night sweats, fever, chills, weight loss Neck: Negative for lumps, goiter, pain and significant neck swelling Resp: Negative for cough, wheezing, dyspnea at rest CV: Negative for chest  pain, leg swelling, palpitations, orthopnea GI: denies melena, hematochezia, nausea, vomiting, diarrhea, constipation, dysphagia, odyonophagia, early satiety or unintentional weight loss.  MSK: Negative for joint pain or swelling, back pain, and muscle pain. Derm: Negative for itching or rash Psych: Denies depression, anxiety, memory loss, confusion. No homicidal or suicidal ideation.  Heme: Negative for prolonged bleeding, bruising easily, and swollen nodes. Endocrine: Negative for cold or heat intolerance, polyuria, polydipsia and goiter. Neuro: negative for tremor, gait imbalance, syncope and seizures. The remainder of the review of systems is noncontributory.  Physical Exam: BP 97/65 (BP Location: Right Arm, Patient Position: Sitting, Cuff Size: Large)    Pulse 72    Temp 97.8 F (36.6 C) (Oral)    Ht 5' 5"  (1.651 m)    Wt 228 lb (103.4 kg)    BMI 37.94 kg/m  General:   Alert and oriented. No distress noted. Pleasant and cooperative.  Head:  Normocephalic and atraumatic. Eyes:  Conjuctiva clear without scleral icterus. Mouth:  Oral mucosa pink and moist. Good dentition. No lesions. Heart: Normal rate and rhythm, s1 and s2 heart sounds present.  Lungs: Clear lung sounds in all lobes. Respirations equal and unlabored. Abdomen:  +BS, soft,  non-tender, full but soft. No rebound or guarding. No HSM or masses noted. Derm: No palmar erythema or jaundice Msk:  Symmetrical without gross deformities. Normal posture. Extremities:  Without edema. Neurologic:  Alert and  oriented x4 Psych:  Alert and cooperative. Normal mood and affect.  Invalid input(s): 6 MONTHS   ASSESSMENT: SASHA ROGEL is a 55 y.o. female presenting today as a new patient for cirrhosis/hx of esophageal varices.  Patient previously followed in [place] for her NASH cirrhosis, however, she is not up to date on MELD labs, liver US or EGD, she has hx of varices and reports intermittent melena and hematochezia that has been ongoing for the past few years, since last EGD and colonoscopy. She has had no episodes of confusion, hematemesis, jaundice, pruritus, swelling to her abdomen, fevers or chills. We will continue with current aldactone dose, we will update MELD labs, RUQ [place] and get her scheduled for EGD and Colonsocopy given she is continuing to have melena and rectal bleeding and has not had recent evaluation of varices since 2018. Reassuringly she has had no episodes of confusion. I discussed the importance of 6 month monitoring of her liver with labs and [place], as well as routine surveillance EGD given her hx of varices. Patient verbalized understanding. Indications, risks and benefits of procedure discussed in detail with patient. Patient verbalized understanding and is in agreement to proceed with EGD and colonoscopy at this time.   PLAN:  INR, AFP, RUQ [place], CBC, CMP 2.. EGD and Colonoscopy (2 day prep, per recommendations after last colonoscopy) 3. Continue aldactone 25 mg BID 4. Further recommendations regarding beta blocker to be determined after EGD 5. Reduce salt intake to <2 g per day 6. Can take Tylenol max of 2 g per day (650 mg q8h) for pain 7. Avoid NSAIDs for pain 8.  Avoid eating raw oysters/shellfish 9. Ensure every night before going to  sleep   Follow Up: 6 months  Maleigha Colvard L. Alver Sorrow, MSN, APRN, AGNP-C Adult-Gerontology Nurse Practitioner [facility]

## 2021-09-19 NOTE — Patient Instructions (Signed)
We will update your cirrhosis labs today, these need to be monitored every 6 months We will also get you scheduled for Liver ultrasound, as this needs to be done every 6 months as well. We will also set you up for EGD and colonoscopy for further evaluation of rectal bleeding and history of esophageal varices.   If you develop worsening rectal bleeding, fatigue, dizziness, you pass out or vomit blood, these are signs of significant blood loss/bleeding varices, this is an emergency and you need to proceed to the ED.  - Reduce salt intake to <2 g per day - Can take Tylenol max of 2 g per day (650 mg q8h) for pain - Avoid NSAIDs for pain (advil, aleve, naproxen, goody powder, ibuprofen) - Avoid eating raw oysters/shellfish - Ensure every night before going to sleep]  Follow up 6 Month

## 2021-09-20 ENCOUNTER — Encounter (INDEPENDENT_AMBULATORY_CARE_PROVIDER_SITE_OTHER): Payer: Self-pay

## 2021-09-20 ENCOUNTER — Other Ambulatory Visit (INDEPENDENT_AMBULATORY_CARE_PROVIDER_SITE_OTHER): Payer: Self-pay

## 2021-09-20 DIAGNOSIS — K921 Melena: Secondary | ICD-10-CM | POA: Insufficient documentation

## 2021-09-20 LAB — CBC
HCT: 33.8 % — ABNORMAL LOW (ref 35.0–45.0)
Hemoglobin: 10.8 g/dL — ABNORMAL LOW (ref 11.7–15.5)
MCH: 29.3 pg (ref 27.0–33.0)
MCHC: 32 g/dL (ref 32.0–36.0)
MCV: 91.6 fL (ref 80.0–100.0)
MPV: 13.8 fL — ABNORMAL HIGH (ref 7.5–12.5)
Platelets: 62 10*3/uL — ABNORMAL LOW (ref 140–400)
RBC: 3.69 10*6/uL — ABNORMAL LOW (ref 3.80–5.10)
RDW: 13.8 % (ref 11.0–15.0)
WBC: 3.7 10*3/uL — ABNORMAL LOW (ref 3.8–10.8)

## 2021-09-20 LAB — COMPREHENSIVE METABOLIC PANEL
AG Ratio: 1.7 (calc) (ref 1.0–2.5)
ALT: 13 U/L (ref 6–29)
AST: 16 U/L (ref 10–35)
Albumin: 4 g/dL (ref 3.6–5.1)
Alkaline phosphatase (APISO): 51 U/L (ref 37–153)
BUN/Creatinine Ratio: 16 (calc) (ref 6–22)
BUN: 20 mg/dL (ref 7–25)
CO2: 26 mmol/L (ref 20–32)
Calcium: 9.7 mg/dL (ref 8.6–10.4)
Chloride: 108 mmol/L (ref 98–110)
Creat: 1.22 mg/dL — ABNORMAL HIGH (ref 0.50–1.03)
Globulin: 2.4 g/dL (calc) (ref 1.9–3.7)
Glucose, Bld: 121 mg/dL (ref 65–139)
Potassium: 4.7 mmol/L (ref 3.5–5.3)
Sodium: 143 mmol/L (ref 135–146)
Total Bilirubin: 0.3 mg/dL (ref 0.2–1.2)
Total Protein: 6.4 g/dL (ref 6.1–8.1)

## 2021-09-20 LAB — PROTIME-INR
INR: 1.1
Prothrombin Time: 10.9 s (ref 9.0–11.5)

## 2021-09-20 LAB — AFP TUMOR MARKER: AFP-Tumor Marker: 2.2 ng/mL

## 2021-09-29 ENCOUNTER — Ambulatory Visit (HOSPITAL_COMMUNITY)
Admission: RE | Admit: 2021-09-29 | Discharge: 2021-09-29 | Disposition: A | Discharge status: Home / Self Care | Payer: Medicare HMO | Source: Ambulatory Visit | Attending: Gastroenterology | Admitting: Gastroenterology

## 2021-09-29 ENCOUNTER — Other Ambulatory Visit: Payer: Self-pay

## 2021-09-29 DIAGNOSIS — K7581 Nonalcoholic steatohepatitis (NASH): Secondary | ICD-10-CM | POA: Diagnosis not present

## 2021-09-29 DIAGNOSIS — K7460 Unspecified cirrhosis of liver: Secondary | ICD-10-CM | POA: Insufficient documentation

## 2021-10-26 ENCOUNTER — Encounter (HOSPITAL_COMMUNITY): Payer: Self-pay | Admitting: Gastroenterology

## 2021-10-26 ENCOUNTER — Encounter (INDEPENDENT_AMBULATORY_CARE_PROVIDER_SITE_OTHER): Payer: Self-pay | Admitting: *Deleted

## 2021-10-26 ENCOUNTER — Encounter (HOSPITAL_COMMUNITY)
Admission: RE | Disposition: A | Discharge status: Home / Self Care | Payer: Self-pay | Source: Home / Self Care | Attending: Gastroenterology

## 2021-10-26 ENCOUNTER — Other Ambulatory Visit: Payer: Self-pay

## 2021-10-26 ENCOUNTER — Ambulatory Visit (HOSPITAL_COMMUNITY): Payer: Medicare HMO | Admitting: Certified Registered"

## 2021-10-26 ENCOUNTER — Ambulatory Visit (HOSPITAL_COMMUNITY)
Admission: RE | Admit: 2021-10-26 | Discharge: 2021-10-26 | Disposition: A | Discharge status: Home / Self Care | Payer: Medicare HMO | Attending: Gastroenterology | Admitting: Gastroenterology

## 2021-10-26 DIAGNOSIS — K635 Polyp of colon: Secondary | ICD-10-CM

## 2021-10-26 DIAGNOSIS — Z87891 Personal history of nicotine dependence: Secondary | ICD-10-CM | POA: Insufficient documentation

## 2021-10-26 DIAGNOSIS — E7800 Pure hypercholesterolemia, unspecified: Secondary | ICD-10-CM | POA: Diagnosis not present

## 2021-10-26 DIAGNOSIS — I10 Essential (primary) hypertension: Secondary | ICD-10-CM | POA: Insufficient documentation

## 2021-10-26 DIAGNOSIS — E039 Hypothyroidism, unspecified: Secondary | ICD-10-CM | POA: Insufficient documentation

## 2021-10-26 DIAGNOSIS — D123 Benign neoplasm of transverse colon: Secondary | ICD-10-CM | POA: Diagnosis not present

## 2021-10-26 DIAGNOSIS — K921 Melena: Secondary | ICD-10-CM | POA: Insufficient documentation

## 2021-10-26 DIAGNOSIS — K5521 Angiodysplasia of colon with hemorrhage: Secondary | ICD-10-CM | POA: Diagnosis not present

## 2021-10-26 DIAGNOSIS — K5520 Angiodysplasia of colon without hemorrhage: Secondary | ICD-10-CM

## 2021-10-26 DIAGNOSIS — K7581 Nonalcoholic steatohepatitis (NASH): Secondary | ICD-10-CM | POA: Diagnosis not present

## 2021-10-26 DIAGNOSIS — M1990 Unspecified osteoarthritis, unspecified site: Secondary | ICD-10-CM | POA: Insufficient documentation

## 2021-10-26 DIAGNOSIS — K648 Other hemorrhoids: Secondary | ICD-10-CM | POA: Insufficient documentation

## 2021-10-26 DIAGNOSIS — Z79899 Other long term (current) drug therapy: Secondary | ICD-10-CM | POA: Insufficient documentation

## 2021-10-26 DIAGNOSIS — K625 Hemorrhage of anus and rectum: Secondary | ICD-10-CM

## 2021-10-26 DIAGNOSIS — K7460 Unspecified cirrhosis of liver: Secondary | ICD-10-CM | POA: Diagnosis not present

## 2021-10-26 DIAGNOSIS — E119 Type 2 diabetes mellitus without complications: Secondary | ICD-10-CM | POA: Diagnosis not present

## 2021-10-26 DIAGNOSIS — K317 Polyp of stomach and duodenum: Secondary | ICD-10-CM | POA: Insufficient documentation

## 2021-10-26 DIAGNOSIS — Z8719 Personal history of other diseases of the digestive system: Secondary | ICD-10-CM

## 2021-10-26 HISTORY — PX: POLYPECTOMY: SHX5525

## 2021-10-26 HISTORY — PX: HEMOSTASIS CLIP PLACEMENT: SHX6857

## 2021-10-26 HISTORY — PX: COLONOSCOPY WITH PROPOFOL: SHX5780

## 2021-10-26 HISTORY — PX: ESOPHAGOGASTRODUODENOSCOPY (EGD) WITH PROPOFOL: SHX5813

## 2021-10-26 LAB — GLUCOSE, CAPILLARY: Glucose-Capillary: 135 mg/dL — ABNORMAL HIGH (ref 70–99)

## 2021-10-26 LAB — HM COLONOSCOPY

## 2021-10-26 SURGERY — COLONOSCOPY WITH PROPOFOL
Anesthesia: General

## 2021-10-26 MED ORDER — STERILE WATER FOR IRRIGATION IR SOLN
Status: DC | PRN
  Administered 2021-10-26: 120 mL

## 2021-10-26 MED ORDER — GLUCAGON HCL RDNA (DIAGNOSTIC) 1 MG IJ SOLR
INTRAMUSCULAR | Status: AC
  Filled 2021-10-26: qty 1

## 2021-10-26 MED ORDER — EPHEDRINE SULFATE-NACL 50-0.9 MG/10ML-% IV SOSY
PREFILLED_SYRINGE | INTRAVENOUS | Status: DC | PRN
  Administered 2021-10-26 (×3): 5 mg via INTRAVENOUS

## 2021-10-26 MED ORDER — GLUCAGON HCL RDNA (DIAGNOSTIC) 1 MG IJ SOLR
INTRAMUSCULAR | Status: DC | PRN
Start: 2021-10-26 — End: 2021-10-26
  Administered 2021-10-26 (×3): .25 mg via INTRAVENOUS

## 2021-10-26 MED ORDER — EPHEDRINE 5 MG/ML INJ
INTRAVENOUS | Status: AC
  Filled 2021-10-26: qty 5

## 2021-10-26 MED ORDER — LACTATED RINGERS IV SOLN: INTRAVENOUS | Status: DC

## 2021-10-26 MED ORDER — LIDOCAINE HCL (CARDIAC) PF 100 MG/5ML IV SOSY
PREFILLED_SYRINGE | INTRAVENOUS | Status: DC | PRN
  Administered 2021-10-26: 50 mg via INTRAVENOUS

## 2021-10-26 MED ORDER — PROPOFOL 500 MG/50ML IV EMUL
INTRAVENOUS | Status: DC | PRN
  Administered 2021-10-26: 150 ug/kg/min via INTRAVENOUS

## 2021-10-26 MED ORDER — PHENYLEPHRINE 40 MCG/ML (10ML) SYRINGE FOR IV PUSH (FOR BLOOD PRESSURE SUPPORT)
PREFILLED_SYRINGE | INTRAVENOUS | Status: AC
  Filled 2021-10-26: qty 10

## 2021-10-26 MED ORDER — PROPOFOL 10 MG/ML IV BOLUS
INTRAVENOUS | Status: DC | PRN
  Administered 2021-10-26: 100 mg via INTRAVENOUS

## 2021-10-26 MED ORDER — PHENYLEPHRINE 40 MCG/ML (10ML) SYRINGE FOR IV PUSH (FOR BLOOD PRESSURE SUPPORT)
PREFILLED_SYRINGE | INTRAVENOUS | Status: DC | PRN
  Administered 2021-10-26: 80 ug via INTRAVENOUS
  Administered 2021-10-26: 120 ug via INTRAVENOUS
  Administered 2021-10-26 (×2): 80 ug via INTRAVENOUS
  Administered 2021-10-26: 120 ug via INTRAVENOUS

## 2021-10-26 NOTE — Anesthesia Preprocedure Evaluation (Signed)
Anesthesia Evaluation  Patient identified by MRN, date of birth, ID band Patient awake    Reviewed: Allergy & Precautions, H&P , NPO status , Patient's Chart, lab work & pertinent test results, reviewed documented beta blocker date and time   Airway Mallampati: II  TM Distance: >3 FB Neck ROM: full    Dental no notable dental hx.    Pulmonary neg pulmonary ROS, former smoker,    Pulmonary exam normal breath sounds clear to auscultation       Cardiovascular Exercise Tolerance: Good hypertension, negative cardio ROS   Rhythm:regular Rate:Normal     Neuro/Psych negative neurological ROS  negative psych ROS   GI/Hepatic negative GI ROS, (+) Cirrhosis   Esophageal Varices    , Hepatitis -  Endo/Other  diabetes, Type 2Hypothyroidism   Renal/GU negative Renal ROS  negative genitourinary   Musculoskeletal   Abdominal   Peds  Hematology negative hematology ROS (+)   Anesthesia Other Findings   Reproductive/Obstetrics negative OB ROS                             Anesthesia Physical Anesthesia Plan  ASA: 3  Anesthesia Plan: General   Post-op Pain Management:    Induction:   PONV Risk Score and Plan: Propofol infusion  Airway Management Planned:   Additional Equipment:   Intra-op Plan:   Post-operative Plan:   Informed Consent: I have reviewed the patients History and Physical, chart, labs and discussed the procedure including the risks, benefits and alternatives for the proposed anesthesia with the patient or authorized representative who has indicated his/her understanding and acceptance.     Dental Advisory Given  Plan Discussed with: CRNA  Anesthesia Plan Comments:         Anesthesia Quick Evaluation

## 2021-10-26 NOTE — Transfer of Care (Signed)
Immediate Anesthesia Transfer of Care Note  Patient: Priscilla Roberts  Procedure(s) Performed: COLONOSCOPY WITH PROPOFOL ESOPHAGOGASTRODUODENOSCOPY (EGD) WITH PROPOFOL POLYPECTOMY HEMOSTASIS CLIP PLACEMENT  Patient Location: Endoscopy Unit  Anesthesia Type:General  Level of Consciousness: drowsy  Airway & Oxygen Therapy: Patient Spontanous Breathing  Post-op Assessment: Report given to RN and Post -op Vital signs reviewed and stable  Post vital signs: Reviewed and stable  Last Vitals:  Vitals Value Taken Time  BP    Temp    Pulse    Resp    SpO2      Last Pain:  Vitals:   10/26/21 1030  TempSrc:   PainSc: 10-Worst pain ever      Patients Stated Pain Goal: 9 (67/70/34 0352)  Complications: No notable events documented.

## 2021-10-26 NOTE — Op Note (Addendum)
[facility] Patient Name: Priscilla Roberts Procedure Date: 10/26/2021 10:17 AM MRN: [id number] Date of Birth: 1966-02-04 Attending MD: [name] ,  CSN: [id number] Age: 57 Admit Type: Outpatient Procedure:                Upper GI endoscopy Indications:              Melena Providers:                [name], Caprice Kluver, Kristine L. Risa Grill, Technician Referring MD:              Medicines:                Monitored Anesthesia Care Complications:            No immediate complications. Estimated Blood Loss:     Estimated blood loss: none. Procedure:                Pre-Anesthesia Assessment:                           - Prior to the procedure, a History and Physical                            was performed, and patient medications, allergies                            and sensitivities were reviewed. The patient's                            tolerance of previous anesthesia was reviewed.                           - The risks and benefits of the procedure and the                            sedation options and risks were discussed with the                            patient. All questions were answered and informed                            consent was obtained.                           - ASA Grade Assessment: III - A patient with severe                            systemic disease.                           After obtaining informed consent, the endoscope was                            passed under direct vision. Throughout the  procedure, the patient's blood pressure, pulse, and                            oxygen saturations were monitored continuously. The                            [id number]-[id number] ([id number]) scope was introduced through the                            mouth, and advanced to the second part of duodenum.                            The upper GI endoscopy was accomplished without                             difficulty. The patient tolerated the procedure                            well. Scope In: 10:35:12 AM Scope Out: 11:45:45 AM Total Procedure Duration: 1 hour 10 minutes 33 seconds  Findings:      The examined esophagus was normal.      Multiple greater with largest one with a size of >50 mm pedunculated and       sessile polyps with no bleeding and stigmata of recent bleeding were       found in the gastric body and in the gastric antrum. These polyps were       removed with a hot snare. Resection and retrieval were complete. The       largest polyp was almost completely resected on a piecemeal fashion. To       prevent bleeding post-intervention, four hemostatic clips were       successfully placed at the base of the largest polyp (two Resolution 360       and 2 ultra clip). There was no bleeding at the end of the procedure.      The examined duodenum was normal. Impression:               - Normal esophagus.                           - Multiple gastric polyps. Resected and retrieved.                            Clips were placed.                           - Normal examined duodenum. Moderate Sedation:      Per Anesthesia Care Recommendation:           - Discharge patient to home (ambulatory).                           - Full liquid diet today, advance to soft diet in                            next two days and then restart regular diet.                           -  Await pathology results.                           - Repeat upper endoscopy in 2 months for                            surveillance and treatment of other polyps - will                            need an hour slot.                           - Continue propranolol 40 mg twice a day.                           - Continue pantoprazole 40 mg twice a day. Procedure Code(s):        --- Professional ---                           [phone], Esophagogastroduodenoscopy, flexible,                            transoral; with removal of  tumor(s), polyp(s), or                            other lesion(s) by snare technique Diagnosis Code(s):        --- Professional ---                           K31.7, Polyp of stomach and duodenum                           K92.1, Melena (includes Hematochezia) CPT copyright 2019 American Medical Association. All rights reserved. The codes documented in this report are preliminary and upon coder review may  be revised to meet current compliance requirements. [name], MD [name],  10/26/2021 12:13:56 PM This report has been signed electronically. Number of Addenda: 0

## 2021-10-26 NOTE — Discharge Instructions (Addendum)
You are being discharged to home.  Take a full liquid diet today, advance to soft diet in next two days and then restart regular diet.  We are waiting for your pathology results.  Your physician has recommended a repeat upper endoscopy in two months for surveillance. .. Continue propranolol 40 mg twice a day. Continue pantoprazole 40 mg twice a day. Your physician has recommended a repeat colonoscopy in five years for surveillance.

## 2021-10-26 NOTE — Anesthesia Procedure Notes (Signed)
Date/Time: 10/26/2021 10:37 AM Performed by: Orlie Dakin, CRNA Pre-anesthesia Checklist: Emergency Drugs available, Patient identified, Suction available and Patient being monitored Patient Re-evaluated:Patient Re-evaluated prior to induction Oxygen Delivery Method: Nasal cannula Induction Type: IV induction Placement Confirmation: positive ETCO2

## 2021-10-26 NOTE — H&P (Signed)
Priscilla Roberts is an 56 y.o. female.   Chief Complaint: melena, hematochezia  HPI: Priscilla Roberts is a 56 y.o. female with past medical history of arthritis, DM, high cholesterol, HTN, thyroid disease, NASH cirrhosis, esophageal varices, gastric antral vascular ectasia coming to the hospital for evaluation of melena hematochezia.  The patient states feeling well, has presented intermittent melena hematochezia in the past.  Denies any nausea, vomiting, fever, chills, abdominal distention.  He is currently taking propranolol 40 mg twice a day.  Last EGD :10/31/16, Dr. Rolm Bookbinder, Esophagus: The GE junction and Z-line were at 40 cm. Scarring in the lower esophagus, no varices seen. 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 Duodenum: The pylorus was patent. The first and second part of the duodenum were examined and appeared normal.  Last colnoscopy:10/31/16, Dr. Kallie Locks polyps in the TC , one was removed with cold snare (24m) and the other with cold forceps (258m Medium internal hemorrhoids seen on retroflexion.  Past Medical History:  Diagnosis Date   Arthritis    Cancer ([facility]   Diabetes mellitus without complication ([facility]   High cholesterol    Hypertension    [facility]   Thyroid disease     Past Surgical History:  Procedure Laterality Date   ABDOMINAL HYSTERECTOMY     JOINT REPLACEMENT Bilateral    bilateral thumb joint replacement   THUMB ARTHROSCOPY      History reviewed. No pertinent family history. Social History:  reports that she has quit smoking. She has never used smokeless tobacco. She reports that she does not drink alcohol and does not use drugs.  Allergies:  Allergies  Allergen Reactions   Prednisone Other (See Comments)    Other reaction(s): Mental Status Changes (intolerance), Other  (See Comments) Patient cannot recall this reaction, but recalls feet hurting alfterwards Mental Status Changes (intolerance)    Tizanidine Other (See Comments)    Other reaction(s): Mental Status Changes (intolerance), Other (See Comments) Cannot recall this reaction Mental Status Changes (intolerance)    Chocolate Flavor Other (See Comments)     Migraine headache    Lorazepam Other (See Comments)    Feels like "bugs are crawling all over me." Emotional and crying, stayed in bed.    Other Rash    Plastic tape Prefers paper tape Plastic tape (paper tape is fine.)    Tramadol Other (See Comments)    "feels like bugs crawling on me"    Medications Prior to Admission  Medication Sig Dispense Refill   ascorbic acid (VITAMIN C) 500 MG tablet Take 500 mg by mouth daily.     Cholecalciferol 50 MCG (2000 UT) CAPS Take 2,000 Units by mouth daily.     cyanocobalamin 1000 MCG tablet Take 1,000 mcg by mouth daily.     ferrous sulfate 325 (65 FE) MG tablet Take 325 mg by mouth daily with breakfast.     folic acid (FOLVITE) 1 MG tablet Take 1 mg by mouth daily.     Garlic 108413G CAPS Take 1,000 mg by mouth daily. One qam     Levothyroxine Sodium 100 MCG/ML SOLN Take 100 mcg by mouth daily.     lisinopril (PRINIVIL,ZESTRIL) 2.5 MG tablet Take 2.5 mg by mouth daily.     metFORMIN (GLUCOPHAGE) 1000 MG tablet Take 1,000 mg by mouth  2 (two) times daily with a meal.     Multiple Vitamin (MULTIVITAMIN WITH MINERALS) TABS Take 1 tablet by mouth daily.     pantoprazole (PROTONIX) 40 MG tablet Take 40 mg by mouth 2 (two) times daily.     PARoxetine (PAXIL) 10 MG tablet Take 10 mg by mouth daily.     pregabalin (LYRICA) 300 MG capsule Take 300 mg by mouth 2 (two) times daily.     propranolol (INDERAL) 40 MG tablet Take 40 mg by mouth 2 (two) times daily.     rosuvastatin (CRESTOR) 5 MG tablet Take 5 mg by mouth every evening.     spironolactone (ALDACTONE) 25 MG tablet Take 25 mg by mouth 2 (two)  times daily.     topiramate (TOPAMAX) 50 MG tablet Take 50 mg by mouth 2 (two) times daily.     zinc gluconate 50 MG tablet Take 50 mg by mouth daily.     polyethylene glycol-electrolytes (TRILYTE) 420 g solution Take 4,000 mLs by mouth as directed. 4000 mL 0    Results for orders placed or performed during the hospital encounter of 10/26/21 (from the past 48 hour(s))  Glucose, capillary     Status: Abnormal   Collection Time: 10/26/21  9:56 AM  Result Value Ref Range   Glucose-Capillary 135 (H) 70 - 99 mg/dL    Comment: Glucose reference range applies only to samples taken after fasting for at least 8 hours.   No results found.  Review of Systems  Constitutional: Negative.   HENT: Negative.    Eyes: Negative.   Respiratory: Negative.    Cardiovascular: Negative.   Gastrointestinal:  Positive for anal bleeding and blood in stool.  Endocrine: Negative.   Genitourinary: Negative.   Musculoskeletal: Negative.   Skin: Negative.   Allergic/Immunologic: Negative.   Neurological: Negative.   Hematological: Negative.   Psychiatric/Behavioral: Negative.     Blood pressure (!) 120/48, pulse 61, temperature 98.3 F (36.8 C), temperature source Oral, resp. rate 18, height 5' 6"  (1.676 m), weight 101.2 kg, SpO2 95 %. Physical Exam  GENERAL: The patient is AO x3, in no acute distress. HEENT: Head is normocephalic and atraumatic. EOMI are intact. Mouth is well hydrated and without lesions. NECK: Supple. No masses LUNGS: Clear to auscultation. No presence of rhonchi/wheezing/rales. Adequate chest expansion HEART: RRR, normal s1 and s2. ABDOMEN: Soft, nontender, no guarding, no peritoneal signs, and nondistended. BS +. No masses. EXTREMITIES: Without any cyanosis, clubbing, rash, lesions or edema. NEUROLOGIC: AOx3, no focal motor deficit. SKIN: no jaundice, no rashes  Assessment/Plan [name] is a 56 y.o. female with past medical history of arthritis, DM, high cholesterol, HTN,  thyroid disease, NASH cirrhosis, esophageal varices, gastric antral vascular ectasia coming to the hospital for evaluation of melena hematochezia.  We will proceed with EGD and colonoscopy.  Harvel Quale, MD 10/26/2021, 10:24 AM

## 2021-10-26 NOTE — Op Note (Signed)
[facility] Patient Name: Priscilla Roberts Procedure Date: 10/26/2021 11:50 AM MRN: [id number] Date of Birth: 1966-02-14 Attending MD: [name] ,  CSN: [id number] Age: 56 Admit Type: Outpatient Procedure:                Colonoscopy Indications:              Rectal bleeding Providers:                [name], Caprice Kluver, [employer] Risa Grill, Technician Referring MD:              Medicines:                Monitored Anesthesia Care Complications:            No immediate complications. Estimated Blood Loss:     Estimated blood loss: none. Procedure:                Pre-Anesthesia Assessment:                           - Prior to the procedure, a History and Physical                            was performed, and patient medications, allergies                            and sensitivities were reviewed. The patient's                            tolerance of previous anesthesia was reviewed.                           - The risks and benefits of the procedure and the                            sedation options and risks were discussed with the                            patient. All questions were answered and informed                            consent was obtained.                           - ASA Grade Assessment: III - A patient with severe                            systemic disease.                           After obtaining informed consent, the colonoscope                            was passed under direct vision. Throughout the  procedure, the patient's blood pressure, pulse, and                            oxygen saturations were monitored continuously. The                            PCF-HQ190L ([id number]) was introduced through the                            anus and advanced to the the cecum, identified by                            appendiceal orifice and ileocecal valve. The                             colonoscopy was performed without difficulty. The                            patient tolerated the procedure well. The quality                            of the bowel preparation was adequate to identify                            polyps 6 mm and larger in size. Scope In: 11:53:50 AM Scope Out: 12:10:16 PM Scope Withdrawal Time: 0 hours 11 minutes 8 seconds  Total Procedure Duration: 0 hours 16 minutes 26 seconds  Findings:      The perianal and digital rectal examinations were normal.      A 5 mm polyp was found in the transverse colon. The polyp was sessile.       The polyp was removed with a cold snare. Resection and retrieval were       complete.      A single small localized angiodysplastic lesion without bleeding was       found in the sigmoid colon.      Non-bleeding internal hemorrhoids were found during retroflexion. The       hemorrhoids were small. Impression:               - One 5 mm polyp in the transverse colon, removed                            with a cold snare. Resected and retrieved.                           - A single non-bleeding colonic angiodysplastic                            lesion.                           - Non-bleeding internal hemorrhoids. Moderate Sedation:      Per Anesthesia Care Recommendation:           - Discharge patient to home (ambulatory).                           -  Resume previous diet.                           - Await pathology results.                           - Repeat colonoscopy in 5 years for surveillance. Procedure Code(s):        --- Professional ---                           [phone], Colonoscopy, flexible; with removal of                            tumor(s), polyp(s), or other lesion(s) by snare                            technique Diagnosis Code(s):        --- Professional ---                           K63.5, Polyp of colon                           K55.20, Angiodysplasia of colon without hemorrhage                            K64.8, Other hemorrhoids                           K62.5, Hemorrhage of anus and rectum CPT copyright 2019 American Medical Association. All rights reserved. The codes documented in this report are preliminary and upon coder review may  be revised to meet current compliance requirements. [name], MD [name],  10/26/2021 12:16:49 PM This report has been signed electronically. Number of Addenda: 0

## 2021-10-26 NOTE — Anesthesia Postprocedure Evaluation (Signed)
Anesthesia Post Note  Patient: Priscilla Roberts  Procedure(s) Performed: COLONOSCOPY WITH PROPOFOL ESOPHAGOGASTRODUODENOSCOPY (EGD) WITH PROPOFOL POLYPECTOMY HEMOSTASIS CLIP PLACEMENT  Patient location during evaluation: Phase II Anesthesia Type: General Level of consciousness: awake Pain management: pain level controlled Vital Signs Assessment: post-procedure vital signs reviewed and stable Respiratory status: spontaneous breathing and respiratory function stable Cardiovascular status: blood pressure returned to baseline and stable Postop Assessment: no headache and no apparent nausea or vomiting Anesthetic complications: no Comments: Late entry   No notable events documented.   Last Vitals:  Vitals:   10/26/21 1214 10/26/21 1219  BP: (!) 109/38 (!) 97/51  Pulse: 65 68  Resp: 16 15  Temp: (!) 36.4 C   SpO2: 93% 95%    Last Pain:  Vitals:   10/26/21 1219  TempSrc:   PainSc: 0-No pain                 Louann Sjogren

## 2021-10-27 LAB — SURGICAL PATHOLOGY

## 2021-10-28 ENCOUNTER — Encounter (HOSPITAL_COMMUNITY): Payer: Self-pay | Admitting: Gastroenterology

## 2021-10-31 ENCOUNTER — Other Ambulatory Visit (INDEPENDENT_AMBULATORY_CARE_PROVIDER_SITE_OTHER): Payer: Self-pay

## 2021-10-31 DIAGNOSIS — K317 Polyp of stomach and duodenum: Secondary | ICD-10-CM

## 2021-11-01 ENCOUNTER — Encounter (INDEPENDENT_AMBULATORY_CARE_PROVIDER_SITE_OTHER): Payer: Self-pay

## 2021-11-28 ENCOUNTER — Other Ambulatory Visit (HOSPITAL_COMMUNITY): Payer: Medicare HMO

## 2021-12-01 ENCOUNTER — Other Ambulatory Visit (INDEPENDENT_AMBULATORY_CARE_PROVIDER_SITE_OTHER): Payer: Self-pay

## 2021-12-15 ENCOUNTER — Ambulatory Visit: Payer: Medicare HMO | Admitting: Nurse Practitioner

## 2021-12-15 ENCOUNTER — Other Ambulatory Visit: Payer: Self-pay

## 2021-12-15 ENCOUNTER — Encounter: Payer: Self-pay | Admitting: Nurse Practitioner

## 2021-12-15 VITALS — BP 98/63 | HR 65 | Ht 65.5 in | Wt 233.2 lb

## 2021-12-15 DIAGNOSIS — E782 Mixed hyperlipidemia: Secondary | ICD-10-CM | POA: Diagnosis not present

## 2021-12-15 DIAGNOSIS — E039 Hypothyroidism, unspecified: Secondary | ICD-10-CM | POA: Diagnosis not present

## 2021-12-15 DIAGNOSIS — E1165 Type 2 diabetes mellitus with hyperglycemia: Secondary | ICD-10-CM | POA: Diagnosis not present

## 2021-12-15 DIAGNOSIS — I10 Essential (primary) hypertension: Secondary | ICD-10-CM | POA: Diagnosis not present

## 2021-12-15 LAB — POCT GLYCOSYLATED HEMOGLOBIN (HGB A1C): HbA1c, POC (controlled diabetic range): 6.1 % (ref 0.0–7.0)

## 2021-12-15 NOTE — Progress Notes (Signed)
? ?                                                    Endocrinology Consult Note  ?     12/15/2021, 4:19 PM ? ? ?Subjective:  ? ? Patient ID: Priscilla Roberts, female    DOB: [birth year].  ?Priscilla Roberts is being seen in consultation for management of currently uncontrolled symptomatic diabetes requested by  Leeanne Rio, MD. ? ? ?Past Medical History:  ?Diagnosis Date  ? Arthritis   ? Cancer [facility])   ? Diabetes mellitus without complication ([facility])   ? High cholesterol   ? Hypertension   ? Liver cirrhosis secondary to NASH [facility])   ? Thyroid disease   ? ? ?Past Surgical History:  ?Procedure Laterality Date  ? ABDOMINAL HYSTERECTOMY    ? COLONOSCOPY WITH PROPOFOL N/A 10/26/2021  ? Procedure: COLONOSCOPY WITH PROPOFOL;  Surgeon: Harvel Quale, MD;  [facility];  Service: Gastroenterology;  Laterality: N/A;  10:50  ? ESOPHAGOGASTRODUODENOSCOPY (EGD) WITH PROPOFOL N/A 10/26/2021  ? Procedure: ESOPHAGOGASTRODUODENOSCOPY (EGD) WITH PROPOFOL;  Surgeon: Harvel Quale, MD;  Location[facility];  Service: Gastroenterology;  Laterality: N/A;  ? HEMOSTASIS CLIP PLACEMENT  10/26/2021  ? Procedure: HEMOSTASIS CLIP PLACEMENT;  Surgeon: Montez Morita, Quillian Quince, MD;  Location: [facility];  Service: Gastroenterology;;  ? JOINT REPLACEMENT Bilateral   ? bilateral thumb joint replacement  ? POLYPECTOMY  10/26/2021  ? Procedure: POLYPECTOMY;  Surgeon: Harvel Quale, MD;  Location: [facility];  Service: Gastroenterology;;  gastric  ? THUMB ARTHROSCOPY    ? ? ?Social History  ? ?Socioeconomic History  ? Marital status: Married  ?  Spouse name: Not on file  ? Number of children: Not on file  ? Years of education: Not on file  ? Highest education level: Not on file  ?Occupational History  ? Not on file  ?Tobacco Use  ? Smoking status: Former  ? Smokeless tobacco: Never  ?Vaping Use  ? Vaping Use: Never used  ?Substance and Sexual Activity  ? Alcohol use: No  ?  Drug use: No  ? Sexual activity: Yes  ?  Birth control/protection: Surgical  ?Other Topics Concern  ? Not on file  ?Social History Narrative  ? Not on file  ? ?Social Determinants of Health  ? ?Financial Resource Strain: Not on file  ?Food Insecurity: Not on file  ?Transportation Needs: Not on file  ?Physical Activity: Not on file  ?Stress: Not on file  ?Social Connections: Not on file  ? ? ?Family History  ?Problem Relation Age of Onset  ? Hypertension Mother   ? Thyroid disease Mother   ? Diabetes Mother   ? Heart failure Mother   ? ? ?Outpatient Encounter Medications as of 12/15/2021  ?Medication Sig  ? ACCU-CHEK GUIDE test strip   ? ascorbic acid (VITAMIN C) 500 MG tablet Take 500 mg by mouth daily.  ? Cholecalciferol 50 MCG (2000 UT) CAPS Take 2,000 Units by mouth daily.  ? cyanocobalamin 1000 MCG tablet Take 1,000 mcg by mouth daily.  ? ferrous sulfate 325 (65 FE) MG tablet Take 325 mg by mouth daily with breakfast.  ? folic acid (FOLVITE) 1 MG tablet Take 1 mg by mouth daily.  ? Garlic 1324 MG CAPS Take 1,000 mg  by mouth daily. One qam  ? Levothyroxine Sodium 100 MCG/ML SOLN Take 100 mcg by mouth daily.  ? lisinopril (PRINIVIL,ZESTRIL) 2.5 MG tablet Take 2.5 mg by mouth daily.  ? metFORMIN (GLUCOPHAGE) 1000 MG tablet Take 1,000 mg by mouth 2 (two) times daily with a meal.  ? Multiple Vitamin (MULTIVITAMIN WITH MINERALS) TABS Take 1 tablet by mouth daily.  ? pantoprazole (PROTONIX) 40 MG tablet Take 40 mg by mouth 2 (two) times daily.  ? PARoxetine (PAXIL) 10 MG tablet Take 10 mg by mouth daily.  ? pregabalin (LYRICA) 300 MG capsule Take 300 mg by mouth 2 (two) times daily.  ? propranolol (INDERAL) 40 MG tablet Take 40 mg by mouth 2 (two) times daily.  ? rosuvastatin (CRESTOR) 5 MG tablet Take 5 mg by mouth every evening.  ? spironolactone (ALDACTONE) 25 MG tablet Take 25 mg by mouth 2 (two) times daily.  ? topiramate (TOPAMAX) 50 MG tablet Take 50 mg by mouth 2 (two) times daily.  ? zinc gluconate 50 MG tablet  Take 50 mg by mouth daily.  ? ?No facility-administered encounter medications on file as of 12/15/2021.  ? ? ?ALLERGIES: ?Allergies  ?Allergen Reactions  ? Prednisone Other (See Comments)  ?  Other reaction(s): Mental Status Changes (intolerance), Other (See Comments) ?Patient cannot recall this reaction, but recalls feet hurting alfterwards ?Mental Status Changes (intolerance) ?  ? Tizanidine Other (See Comments)  ?  Other reaction(s): Mental Status Changes (intolerance), Other (See Comments) ?Cannot recall this reaction ?Mental Status Changes (intolerance) ?  ? Chocolate Flavor Other (See Comments)  ?   ?Migraine headache ?  ? Lorazepam Other (See Comments)  ?  Feels like "bugs are crawling all over me." ?Emotional and crying, stayed in bed. ?  ? Other Rash  ?  Plastic tape ?Prefers paper tape ?Plastic tape (paper tape is fine.) ?  ? Tramadol Other (See Comments)  ?  "feels like bugs crawling on me"  ? ? ?VACCINATION STATUS: ?Immunization History  ?Administered Date(s) Administered  ? Moderna Sars-Covid-2 Vaccination 12/13/2019, 01/13/2020, 08/02/2020, 03/07/2021  ? ? ?Diabetes ?She presents for her initial diabetic visit. She has type 2 diabetes mellitus. Onset time: Diagnosed at approx age of 2. Her disease course has been fluctuating. Hypoglycemia symptoms include sweats. Pertinent negatives for hypoglycemia include no nervousness/anxiousness or tremors. There are no diabetic associated symptoms. Pertinent negatives for diabetes include no fatigue, no polydipsia, no polyuria and no weight loss. There are no hypoglycemic complications. Symptoms are stable. There are no diabetic complications. Risk factors for coronary artery disease include diabetes mellitus, dyslipidemia, family history, hypertension, obesity and sedentary lifestyle. Current diabetic treatment includes oral agent (monotherapy). She is compliant with treatment most of the time. Her weight is fluctuating minimally. She is following a generally  unhealthy diet. When asked about meal planning, she reported none. She has not had a previous visit with a dietitian. She rarely participates in exercise. Her overall blood glucose range is 110-130 mg/dl. (She presents today for her consultation, accompanied by her husband, with no meter or logs to review.  Her POCT A1c today is 6.1%, improving from last A1c of 6.4%.  She monitors glucose twice daily (morning numbers ranging between 80-130 and evening numbers ranging between 140-180).  She drinks mostly water, some soda and tea.  She eats 2-3 meals per day, relying on meals-on-wheels for her dinner.  She does snack at times.  She stays pretty active but does not engage in routine physical activity outside  her normal daily routine.  She is UTD on eye exam, has seen podiatry in the past but not recently.  She mentions wanting something to help her lose weight.) An ACE inhibitor/angiotensin II receptor blocker is being taken. She does not see a podiatrist.Eye exam is current.  ?Hypertension ?This is a chronic problem. The current episode started more than 1 year ago. The problem has been resolved since onset. The problem is controlled. Associated symptoms include sweats. Pertinent negatives include no palpitations. Agents associated with hypertension include thyroid hormones. Risk factors for coronary artery disease include diabetes mellitus, dyslipidemia, family history, obesity and sedentary lifestyle. Past treatments include ACE inhibitors, diuretics and beta blockers. The current treatment provides moderate improvement. Compliance problems include diet and exercise.  Identifiable causes of hypertension include a thyroid problem.  ?Thyroid Problem ?Presents for follow-up visit. Patient reports no anxiety, cold intolerance, constipation, diarrhea, fatigue, leg swelling, palpitations, tremors, weight gain or weight loss. The symptoms have been stable. Her past medical history is significant for hyperlipidemia.   ? ? ?Review of systems ? ?Constitutional: + Minimally fluctuating body weight, current Body mass index is 38.22 kg/m?., no fatigue, no subjective hyperthermia, no subjective hypothermia ?Eyes: no blurry vision

## 2021-12-15 NOTE — Patient Instructions (Signed)

## 2021-12-22 NOTE — Patient Instructions (Signed)
? ? ? ? ? ? Priscilla Roberts ? 12/22/2021  ?  ? @PREFPERIOPPHARMACY @ ? ? Your procedure is scheduled on  12/27/2021. ? ? Report to [facility] at  0600  A.M. ? ? Call this number if you have problems the morning of surgery: ? [phone] ? ? Remember: ? Follow the diet instructions given to you by the office. ? ?DO NOT take any medications for diabetes the morning of your procedure. ?  ? Take these medicines the morning of surgery with A SIP OF WATER  ? ?levothyroxine, protonix, paxil, lyrica, inderal. ?  ? ? Do not wear jewelry, make-up or nail polish. ? Do not wear lotions, powders, or perfumes, or deodorant. ? Do not shave 48 hours prior to surgery.  Men may shave face and neck. ? Do not bring valuables to the hospital. ? [facility] is not responsible for any belongings or valuables. ? ?Contacts, dentures or bridgework may not be worn into surgery.  Leave your suitcase in the car.  After surgery it may be brought to your room. ? ?For patients admitted to the hospital, discharge time will be determined by your treatment team. ? ?Patients discharged the day of surgery will not be allowed to drive home and must have someone with them for 24 hours.  ? ? ?Special instructions:   DO NOT smoke tobacco or vape for 24 hours before your procedure. ? ?Please read over the following fact sheets that you were given. ?Anesthesia Post-op Instructions and Care and Recovery After Surgery ?  ? ? ? Upper Endoscopy, Adult, Care After ?This sheet gives you information about how to care for yourself after your procedure. Your health care provider may also give you more specific instructions. If you have problems or questions, contact your health care provider. ?What can I expect after the procedure? ?After the procedure, it is common to have: ?A sore throat. ?Mild stomach pain or discomfort. ?Bloating. ?Nausea. ?Follow these instructions at home: ? ?Follow instructions from your health care provider about what to eat or drink after  your procedure. ?Return to your normal activities as told by your health care provider. Ask your health care provider what activities are safe for you. ?Take over-the-counter and prescription medicines only as told by your health care provider. ?If you were given a sedative during the procedure, it can affect you for several hours. Do not drive or operate machinery until your health care provider says that it is safe. ?Keep all follow-up visits as told by your health care provider. This is important. ?Contact a health care provider if you have: ?A sore throat that lasts longer than one day. ?Trouble swallowing. ?Get help right away if: ?You vomit blood or your vomit looks like coffee grounds. ?You have: ?A fever. ?Bloody, black, or tarry stools. ?A severe sore throat or you cannot swallow. ?Difficulty breathing. ?Severe pain in your chest or abdomen. ?Summary ?After the procedure, it is common to have a sore throat, mild stomach discomfort, bloating, and nausea. ?If you were given a sedative during the procedure, it can affect you for several hours. Do not drive or operate machinery until your health care provider says that it is safe. ?Follow instructions from your health care provider about what to eat or drink after your procedure. ?Return to your normal activities as told by your health care provider. ?This information is not intended to replace advice given to you by your health care provider. Make sure you discuss any questions  you have with your health care provider. ?Document Revised: 07/25/2019 Document Reviewed: 02/18/2018 ?Elsevier Patient Education ? [place]. ?Monitored Anesthesia Care, Care After ?This sheet gives you information about how to care for yourself after your procedure. Your health care provider may also give you more specific instructions. If you have problems or questions, contact your health care provider. ?What can I expect after the procedure? ?After the procedure, it is  common to have: ?Tiredness. ?Forgetfulness about what happened after the procedure. ?Impaired judgment for important decisions. ?Nausea or vomiting. ?Some difficulty with balance. ?Follow these instructions at home: ?For the time period you were told by your health care provider: ?  ?Rest as needed. ?Do not participate in activities where you could fall or become injured. ?Do not drive or use machinery. ?Do not drink alcohol. ?Do not take sleeping pills or medicines that cause drowsiness. ?Do not make important decisions or sign legal documents. ?Do not take care of children on your own. ?Eating and drinking ?Follow the diet that is recommended by your health care provider. ?Drink enough fluid to keep your urine pale yellow. ?If you vomit: ?Drink water, juice, or soup when you can drink without vomiting. ?Make sure you have little or no nausea before eating solid foods. ?General instructions ?Have a responsible adult stay with you for the time you are told. It is important to have someone help care for you until you are awake and alert. ?Take over-the-counter and prescription medicines only as told by your health care provider. ?If you have sleep apnea, surgery and certain medicines can increase your risk for breathing problems. Follow instructions from your health care provider about wearing your sleep device: ?Anytime you are sleeping, including during daytime naps. ?While taking prescription pain medicines, sleeping medicines, or medicines that make you drowsy. ?Avoid smoking. ?Keep all follow-up visits as told by your health care provider. This is important. ?Contact a health care provider if: ?You keep feeling nauseous or you keep vomiting. ?You feel light-headed. ?You are still sleepy or having trouble with balance after 24 hours. ?You develop a rash. ?You have a fever. ?You have redness or swelling around the IV site. ?Get help right away if: ?You have trouble breathing. ?You have new-onset confusion at  home. ?Summary ?For several hours after your procedure, you may feel tired. You may also be forgetful and have poor judgment. ?Have a responsible adult stay with you for the time you are told. It is important to have someone help care for you until you are awake and alert. ?Rest as told. Do not drive or operate machinery. Do not drink alcohol or take sleeping pills. ?Get help right away if you have trouble breathing, or if you suddenly become confused. ?This information is not intended to replace advice given to you by your health care provider. Make sure you discuss any questions you have with your health care provider. ?Document Revised: 06/03/2020 Document Reviewed: 08/21/2019 ?Elsevier Patient Education ? [place]. ? ?

## 2021-12-23 ENCOUNTER — Encounter (HOSPITAL_COMMUNITY)
Admission: RE | Admit: 2021-12-23 | Discharge: 2021-12-23 | Disposition: A | Discharge status: Home / Self Care | Payer: Medicare HMO | Source: Ambulatory Visit | Attending: Gastroenterology | Admitting: Gastroenterology

## 2021-12-23 ENCOUNTER — Other Ambulatory Visit (HOSPITAL_COMMUNITY): Admission: RE | Admit: 2021-12-23 | Payer: Medicare HMO | Source: Ambulatory Visit | Admitting: Gastroenterology

## 2021-12-23 ENCOUNTER — Encounter (HOSPITAL_COMMUNITY): Payer: Self-pay

## 2021-12-23 VITALS — BP 134/68 | HR 65 | Temp 97.5°F | Resp 18 | Ht 65.5 in | Wt 233.2 lb

## 2021-12-23 DIAGNOSIS — K7460 Unspecified cirrhosis of liver: Secondary | ICD-10-CM

## 2021-12-23 DIAGNOSIS — K7469 Other cirrhosis of liver: Secondary | ICD-10-CM | POA: Insufficient documentation

## 2021-12-23 DIAGNOSIS — K7581 Nonalcoholic steatohepatitis (NASH): Secondary | ICD-10-CM | POA: Insufficient documentation

## 2021-12-23 DIAGNOSIS — Z01818 Encounter for other preprocedural examination: Secondary | ICD-10-CM | POA: Insufficient documentation

## 2021-12-23 DIAGNOSIS — E1142 Type 2 diabetes mellitus with diabetic polyneuropathy: Secondary | ICD-10-CM | POA: Insufficient documentation

## 2021-12-23 DIAGNOSIS — D696 Thrombocytopenia, unspecified: Secondary | ICD-10-CM

## 2021-12-23 HISTORY — DX: Sleep apnea, unspecified: G47.30

## 2021-12-23 LAB — CBC WITH DIFFERENTIAL/PLATELET
Abs Immature Granulocytes: 0.02 10*3/uL (ref 0.00–0.07)
Basophils Absolute: 0 10*3/uL (ref 0.0–0.1)
Basophils Relative: 1 %
Eosinophils Absolute: 0.2 10*3/uL (ref 0.0–0.5)
Eosinophils Relative: 8 %
HCT: 33.9 % — ABNORMAL LOW (ref 36.0–46.0)
Hemoglobin: 10.2 g/dL — ABNORMAL LOW (ref 12.0–15.0)
Immature Granulocytes: 1 %
Lymphocytes Relative: 19 %
Lymphs Abs: 0.6 10*3/uL — ABNORMAL LOW (ref 0.7–4.0)
MCH: 29.8 pg (ref 26.0–34.0)
MCHC: 30.1 g/dL (ref 30.0–36.0)
MCV: 99.1 fL (ref 80.0–100.0)
Monocytes Absolute: 0.3 10*3/uL (ref 0.1–1.0)
Monocytes Relative: 9 %
Neutro Abs: 1.9 10*3/uL (ref 1.7–7.7)
Neutrophils Relative %: 62 %
Platelets: 46 10*3/uL — ABNORMAL LOW (ref 150–400)
RBC: 3.42 MIL/uL — ABNORMAL LOW (ref 3.87–5.11)
RDW: 16.7 % — ABNORMAL HIGH (ref 11.5–15.5)
WBC: 2.9 10*3/uL — ABNORMAL LOW (ref 4.0–10.5)
nRBC: 0 % (ref 0.0–0.2)

## 2021-12-23 LAB — COMPREHENSIVE METABOLIC PANEL
ALT: 24 U/L (ref 0–44)
AST: 32 U/L (ref 15–41)
Albumin: 4 g/dL (ref 3.5–5.0)
Alkaline Phosphatase: 57 U/L (ref 38–126)
Anion gap: 9 (ref 5–15)
BUN: 15 mg/dL (ref 6–20)
CO2: 25 mmol/L (ref 22–32)
Calcium: 8.7 mg/dL — ABNORMAL LOW (ref 8.9–10.3)
Chloride: 108 mmol/L (ref 98–111)
Creatinine, Ser: 0.87 mg/dL (ref 0.44–1.00)
GFR, Estimated: >60 mL/min (ref >60)
Glucose, Bld: 147 mg/dL — ABNORMAL HIGH (ref 70–99)
Potassium: 4.5 mmol/L (ref 3.5–5.1)
Sodium: 142 mmol/L (ref 135–145)
Total Bilirubin: 0.6 mg/dL (ref 0.3–1.2)
Total Protein: 6.9 g/dL (ref 6.5–8.1)

## 2021-12-23 LAB — PROTIME-INR
INR: 1.2 (ref 0.8–1.2)
Prothrombin Time: 14.9 seconds (ref 11.4–15.2)

## 2021-12-26 ENCOUNTER — Other Ambulatory Visit (HOSPITAL_COMMUNITY)
Admission: RE | Admit: 2021-12-26 | Discharge: 2021-12-26 | Disposition: A | Discharge status: Home / Self Care | Payer: Medicare HMO | Source: Ambulatory Visit | Attending: Gastroenterology | Admitting: Gastroenterology

## 2021-12-26 DIAGNOSIS — D696 Thrombocytopenia, unspecified: Secondary | ICD-10-CM | POA: Diagnosis present

## 2021-12-26 LAB — CBC WITH DIFFERENTIAL/PLATELET
Abs Immature Granulocytes: 0.02 10*3/uL (ref 0.00–0.07)
Basophils Absolute: 0 10*3/uL (ref 0.0–0.1)
Basophils Relative: 1 %
Eosinophils Absolute: 0.5 10*3/uL (ref 0.0–0.5)
Eosinophils Relative: 13 %
HCT: 33.9 % — ABNORMAL LOW (ref 36.0–46.0)
Hemoglobin: 10.4 g/dL — ABNORMAL LOW (ref 12.0–15.0)
Immature Granulocytes: 1 %
Lymphocytes Relative: 20 %
Lymphs Abs: 0.7 10*3/uL (ref 0.7–4.0)
MCH: 30.3 pg (ref 26.0–34.0)
MCHC: 30.7 g/dL (ref 30.0–36.0)
MCV: 98.8 fL (ref 80.0–100.0)
Monocytes Absolute: 0.3 10*3/uL (ref 0.1–1.0)
Monocytes Relative: 8 %
Neutro Abs: 2.2 10*3/uL (ref 1.7–7.7)
Neutrophils Relative %: 57 %
Platelets: 52 10*3/uL — ABNORMAL LOW (ref 150–400)
RBC: 3.43 MIL/uL — ABNORMAL LOW (ref 3.87–5.11)
RDW: 16.9 % — ABNORMAL HIGH (ref 11.5–15.5)
WBC: 3.7 10*3/uL — ABNORMAL LOW (ref 4.0–10.5)
nRBC: 0 % (ref 0.0–0.2)

## 2021-12-26 LAB — TYPE AND SCREEN
ABO/RH(D): A POS
Antibody Screen: NEGATIVE

## 2021-12-26 NOTE — Pre-Procedure Instructions (Signed)
?  RE: platelets ?Received: Today ?Harvel Quale, MD  Encarnacion Chu, RN; Jacqulynn Cadet, RN; Wilmer Floor, RN ?Hi Breon Diss,  ?I reached [place] regarding this.  I am okay proceeding with this.   ?  ?   ?Previous Messages ?  ?----- Message -----  ?From: Encarnacion Chu, RN  ?Sent: 12/26/2021  12:58 PM EDT  ?To: Wilmer Floor, RN, Jacqulynn Cadet, RN, *  ?Subject: platelets                                      ? ?Hey Dr Jenetta Downer! Shakya Barretta's H&H 10.4/33.9 and platelets are 52. Dr Briant Cedar said to just show you this. We did her with platelets of 62 in January. Dr Briant Cedar is okay with this result if you are.  ? ?  ?

## 2021-12-27 ENCOUNTER — Encounter (HOSPITAL_COMMUNITY): Payer: Self-pay | Admitting: Gastroenterology

## 2021-12-27 ENCOUNTER — Ambulatory Visit (HOSPITAL_COMMUNITY): Payer: Medicare HMO | Admitting: Anesthesiology

## 2021-12-27 ENCOUNTER — Other Ambulatory Visit: Payer: Self-pay

## 2021-12-27 ENCOUNTER — Ambulatory Visit (HOSPITAL_COMMUNITY)
Admission: RE | Admit: 2021-12-27 | Discharge: 2021-12-27 | Disposition: A | Discharge status: Home / Self Care | Payer: Medicare HMO | Attending: Gastroenterology | Admitting: Gastroenterology

## 2021-12-27 ENCOUNTER — Ambulatory Visit (HOSPITAL_BASED_OUTPATIENT_CLINIC_OR_DEPARTMENT_OTHER): Payer: Medicare HMO | Admitting: Anesthesiology

## 2021-12-27 ENCOUNTER — Encounter (HOSPITAL_COMMUNITY)
Admission: RE | Disposition: A | Discharge status: Home / Self Care | Payer: Self-pay | Source: Home / Self Care | Attending: Gastroenterology

## 2021-12-27 DIAGNOSIS — K317 Polyp of stomach and duodenum: Secondary | ICD-10-CM

## 2021-12-27 DIAGNOSIS — D509 Iron deficiency anemia, unspecified: Secondary | ICD-10-CM

## 2021-12-27 DIAGNOSIS — I10 Essential (primary) hypertension: Secondary | ICD-10-CM | POA: Insufficient documentation

## 2021-12-27 DIAGNOSIS — K7581 Nonalcoholic steatohepatitis (NASH): Secondary | ICD-10-CM | POA: Insufficient documentation

## 2021-12-27 DIAGNOSIS — K7460 Unspecified cirrhosis of liver: Secondary | ICD-10-CM | POA: Insufficient documentation

## 2021-12-27 DIAGNOSIS — M1990 Unspecified osteoarthritis, unspecified site: Secondary | ICD-10-CM | POA: Diagnosis not present

## 2021-12-27 DIAGNOSIS — Z79899 Other long term (current) drug therapy: Secondary | ICD-10-CM | POA: Insufficient documentation

## 2021-12-27 DIAGNOSIS — D130 Benign neoplasm of esophagus: Secondary | ICD-10-CM | POA: Diagnosis not present

## 2021-12-27 DIAGNOSIS — I8500 Esophageal varices without bleeding: Secondary | ICD-10-CM

## 2021-12-27 DIAGNOSIS — Z87891 Personal history of nicotine dependence: Secondary | ICD-10-CM | POA: Insufficient documentation

## 2021-12-27 DIAGNOSIS — E7800 Pure hypercholesterolemia, unspecified: Secondary | ICD-10-CM | POA: Diagnosis not present

## 2021-12-27 DIAGNOSIS — E119 Type 2 diabetes mellitus without complications: Secondary | ICD-10-CM | POA: Diagnosis not present

## 2021-12-27 DIAGNOSIS — E039 Hypothyroidism, unspecified: Secondary | ICD-10-CM | POA: Diagnosis not present

## 2021-12-27 DIAGNOSIS — K31819 Angiodysplasia of stomach and duodenum without bleeding: Secondary | ICD-10-CM | POA: Insufficient documentation

## 2021-12-27 DIAGNOSIS — D696 Thrombocytopenia, unspecified: Secondary | ICD-10-CM

## 2021-12-27 DIAGNOSIS — I8510 Secondary esophageal varices without bleeding: Secondary | ICD-10-CM | POA: Diagnosis not present

## 2021-12-27 HISTORY — PX: POLYPECTOMY: SHX149

## 2021-12-27 HISTORY — PX: ESOPHAGOGASTRODUODENOSCOPY (EGD) WITH PROPOFOL: SHX5813

## 2021-12-27 HISTORY — PX: BIOPSY: SHX5522

## 2021-12-27 HISTORY — PX: HEMOSTASIS CLIP PLACEMENT: SHX6857

## 2021-12-27 LAB — GLUCOSE, CAPILLARY
Glucose-Capillary: 138 mg/dL — ABNORMAL HIGH (ref 70–99)
Glucose-Capillary: 134 mg/dL — ABNORMAL HIGH (ref 70–99)

## 2021-12-27 SURGERY — ESOPHAGOGASTRODUODENOSCOPY (EGD) WITH PROPOFOL
Anesthesia: General

## 2021-12-27 MED ORDER — LIDOCAINE HCL (CARDIAC) PF 100 MG/5ML IV SOSY
PREFILLED_SYRINGE | INTRAVENOUS | Status: DC | PRN
  Administered 2021-12-27: 50 mg via INTRAVENOUS

## 2021-12-27 MED ORDER — SODIUM CHLORIDE 0.9% IV SOLUTION: Freq: Once | INTRAVENOUS | Status: DC

## 2021-12-27 MED ORDER — LACTATED RINGERS IV SOLN: INTRAVENOUS | Status: DC

## 2021-12-27 MED ORDER — PROPOFOL 500 MG/50ML IV EMUL
INTRAVENOUS | Status: DC | PRN
  Administered 2021-12-27: 150 ug/kg/min via INTRAVENOUS

## 2021-12-27 MED ORDER — PROPOFOL 10 MG/ML IV BOLUS
INTRAVENOUS | Status: DC | PRN
Start: 2021-12-27 — End: 2021-12-27
  Administered 2021-12-27: 50 mg via INTRAVENOUS

## 2021-12-27 NOTE — Anesthesia Preprocedure Evaluation (Signed)
Anesthesia Evaluation  ?Patient identified by MRN, date of birth, ID band ?Patient awake ? ? ? ?Reviewed: ?Allergy & Precautions, H&P , NPO status , Patient's Chart, lab work & pertinent test results, reviewed documented beta blocker date and time  ? ?Airway ?Mallampati: II ? ?TM Distance: >3 FB ?Neck ROM: full ? ? ? Dental ?no notable dental hx. ? ?  ?Pulmonary ?sleep apnea , former smoker,  ?  ?Pulmonary exam normal ?breath sounds clear to auscultation ? ? ? ? ? ? Cardiovascular ?Exercise Tolerance: Good ?hypertension, negative cardio ROS ? ? ?Rhythm:regular Rate:Normal ? ? ?  ?Neuro/Psych ?negative neurological ROS ? negative psych ROS  ? GI/Hepatic ?negative GI ROS, (+) Hepatitis -, Autoimmune  ?Endo/Other  ?negative endocrine ROSdiabetes ? Renal/GU ?negative Renal ROS  ?negative genitourinary ?  ?Musculoskeletal ? ? Abdominal ?  ?Peds ? Hematology ?negative hematology ROS ?(+)   ?Anesthesia Other Findings ? ? Reproductive/Obstetrics ?negative OB ROS ? ?  ? ? ? ? ? ? ? ? ? ? ? ? ? ?  ?  ? ? ? ? ? ? ? ? ?Anesthesia Physical ?Anesthesia Plan ? ?ASA: 3 ? ?Anesthesia Plan: General  ? ?Post-op Pain Management:   ? ?Induction:  ? ?PONV Risk Score and Plan: Propofol infusion ? ?Airway Management Planned:  ? ?Additional Equipment:  ? ?Intra-op Plan:  ? ?Post-operative Plan:  ? ?Informed Consent: I have reviewed the patients History and Physical, chart, labs and discussed the procedure including the risks, benefits and alternatives for the proposed anesthesia with the patient or authorized representative who has indicated his/her understanding and acceptance.  ? ? ? ?Dental Advisory Given ? ?Plan Discussed with: CRNA ? ?Anesthesia Plan Comments:   ? ? ? ? ? ? ?Anesthesia Quick Evaluation ? ?

## 2021-12-27 NOTE — Discharge Instructions (Signed)
You are being discharged to home.  ?Resume your previous diet.  ?We are waiting for your pathology results.  ?Your physician has recommended a repeat upper endoscopy in one year for surveillance.  ?Continue propranolol 40 mg twice a day. ?Continue oral iron ?Continue pantoprazole 40 mg twice a day. ?

## 2021-12-27 NOTE — Op Note (Signed)
[facility] ?Patient Name: Priscilla Roberts ?Procedure Date: 12/27/2021 7:21 AM ?MRN: [id number] ?Date of Birth: 03/10/1966 ?Attending MD: [name] ,  ?CSN: [id number] ?Age: 56 ?Admit Type: Outpatient ?Procedure:                Upper GI endoscopy ?Indications:              Unexplained iron deficiency anemia, Follow-up of  ?                          gastric polyps ?Providers:                [name], Crystal Page, Randa Spike,  ?                          Technician ?Referring MD:              ?Medicines:                Monitored Anesthesia Care ?Complications:            No immediate complications. ?Estimated Blood Loss:     Estimated blood loss: none. ?Procedure:                Pre-Anesthesia Assessment: ?                          - Prior to the procedure, a History and Physical  ?                          was performed, and patient medications, allergies  ?                          and sensitivities were reviewed. The patient's  ?                          tolerance of previous anesthesia was reviewed. ?                          - The risks and benefits of the procedure and the  ?                          sedation options and risks were discussed with the  ?                          patient. All questions were answered and informed  ?                          consent was obtained. ?                          - ASA Grade Assessment: III - A patient with severe  ?                          systemic disease. ?                          After obtaining informed consent, the endoscope was  ?  passed under direct vision. Throughout the  ?                          procedure, the patient's blood pressure, pulse, and  ?                          oxygen saturations were monitored continuously. The  ?                          GIF-H190 ([id number]) scope was introduced through the  ?                          mouth, and advanced to the second part of duodenum.  ?                          The  upper GI endoscopy was accomplished without  ?                          difficulty. The patient tolerated the procedure  ?                          well. ?Scope In: 7:39:06 AM ?Scope Out: 8:04:02 AM ?Total Procedure Duration: 0 hours 24 minutes 56 seconds  ?Findings: ?     A single 4 mm mucosal nodule with a localized distribution was found in  ?     the upper third of the esophagus, 21 cm from the incisors. It appeared  ?     to have a papilloma appearance. Biopsies were taken with a cold forceps  ?     for histology. ?     Grade I varices were found in the lower third of the esophagus. ?     Multiple 4 to 15 mm pedunculated and sessile polyps with no bleeding and  ?     stigmata of recent bleeding were found in the entire examined stomach.  ?     Two polyps were removed with a hot snare - one of the polyps had a  ?     marked inflammatory appearance and a second polyp had a large size,  ?     close to 15 mm which was the polyp that was partially resected during  ?     most recent EGD. Resection and retrieval were complete. To prevent  ?     bleeding after the polypectomy, three hemostatic clips were successfully  ?     placed (two in the largest polypectomy area). There was no bleeding at  ?     the end of the procedure. ?     The examined duodenum was normal. ?Impression:               - Mucosal nodule found in the esophagus. Biopsied. ?                          - Grade I esophageal varices. ?                          - Multiple gastric polyps. Resected and retrieved  ?  x2. Clips were placed. ?                          - Normal examined duodenum. ?Moderate Sedation: ?     Per Anesthesia Care ?Recommendation:           - Discharge patient to home (ambulatory). ?                          - Resume previous diet. ?                          - Await pathology results. ?                          - Transfuse one U platelets prior to discharge. ?                          - Continue propranolol 40  mg twice a day. ?                          - Continue oral iron ?                          - Continue pantoprazole 40 mg twice a day. ?                          - Repeat upper endoscopy in 1 year for surveillance. ?Procedure Code(s):        --- Professional --- ?                          [phone], Esophagogastroduodenoscopy, flexible,  ?                          transoral; with removal of tumor(s), polyp(s), or  ?                          other lesion(s) by snare technique ?Diagnosis Code(s):        --- Professional --- ?                          I85.00, Esophageal varices without bleeding ?                          K31.7, Polyp of stomach and duodenum ?                          D50.9, Iron deficiency anemia, unspecified ?CPT copyright 2019 American Medical Association. All rights reserved. ?The codes documented in this report are preliminary and upon coder review may  ?be revised to meet current compliance requirements. ?[name], MD ?[name],  ?12/27/2021 8:17:10 AM ?This report has been signed electronically. ?Number of Addenda: 0 ?

## 2021-12-27 NOTE — Transfer of Care (Signed)
Immediate Anesthesia Transfer of Care Note ? ?Patient: Priscilla Roberts ? ?Procedure(s) Performed: ESOPHAGOGASTRODUODENOSCOPY (EGD) WITH PROPOFOL ?POLYPECTOMY INTESTINAL ?HEMOSTASIS CLIP PLACEMENT ?BIOPSY ? ?Patient Location: PACU ? ?Anesthesia Type:General ? ?Level of Consciousness: awake, alert , oriented and patient cooperative ? ?Airway & Oxygen Therapy: Patient Spontanous Breathing and Patient connected to nasal cannula oxygen ? ?Post-op Assessment: Report given to RN, Post -op Vital signs reviewed and stable and Patient moving all extremities X 4 ? ?Post vital signs: Reviewed and stable ? ?Last Vitals:  ?Vitals Value Taken Time  ?BP 103/53 12/27/21 0815  ?Temp 36.6 ?C 12/27/21 0810  ?Pulse 71 12/27/21 0815  ?Resp 19 12/27/21 0815  ?SpO2 98 % 12/27/21 0815  ?Vitals shown include unvalidated device data. ? ?Last Pain:  ?Vitals:  ? 12/27/21 0810  ?TempSrc:   ?PainSc: Asleep  ?   ? ?Patients Stated Pain Goal: 6 (12/27/21 0706) ? ?Complications: No notable events documented. ?

## 2021-12-27 NOTE — Anesthesia Postprocedure Evaluation (Signed)
Anesthesia Post Note ? ?Patient: Priscilla Roberts ? ?Procedure(s) Performed: ESOPHAGOGASTRODUODENOSCOPY (EGD) WITH PROPOFOL ?POLYPECTOMY INTESTINAL ?HEMOSTASIS CLIP PLACEMENT ?BIOPSY ? ?Patient location during evaluation: Phase II ?Anesthesia Type: General ?Level of consciousness: awake ?Pain management: pain level controlled ?Vital Signs Assessment: post-procedure vital signs reviewed and stable ?Respiratory status: spontaneous breathing and respiratory function stable ?Cardiovascular status: blood pressure returned to baseline and stable ?Postop Assessment: no headache and no apparent nausea or vomiting ?Anesthetic complications: no ?Comments: Late entry ? ? ?No notable events documented. ? ? ?Last Vitals:  ?Vitals:  ? 12/27/21 1130 12/27/21 1134  ?BP: 125/64 125/64  ?Pulse: 62 65  ?Resp: 15 18  ?Temp:  36.5 ?C  ?SpO2: 95% 97%  ?  ?Last Pain:  ?Vitals:  ? 12/27/21 1134  ?TempSrc: Oral  ?PainSc:   ? ? ?  ?  ?  ?  ?  ?  ? ?Louann Sjogren ? ? ? ? ?

## 2021-12-27 NOTE — H&P (Signed)
Priscilla Roberts is an 56 y.o. female.   ?Chief Complaint: follow up gastric polyp resection ?HPI: 56 y/o F with PMH past medical history of arthritis, DM, high cholesterol, HTN, thyroid disease, NASH cirrhosis, esophageal varices, gastric antral vascular ectasia, who comes to the hospital for follow up after partial resection of gastric polyp. ? ?Patient denies any complaint. States feeling well.  Patient reports that she has been taking her medications compliantly and denies having any melena, hematochezia, abdominal pain, abdominal distention, fever, chills, lightheadedness, dizziness, nausea, vomiting.  She had some dark stools recently but no overt melena. ? ?Most recent labs from 12/26/2021 showed platelet count of 52, white blood cell count of 3.7 and hemoglobin of 10.4.  Overall there is presence of pancytopenia. ? ?Past Medical History:  ?Diagnosis Date  ? Arthritis   ? Cancer [facility])   ? Diabetes mellitus without complication ([place])   ? High cholesterol   ? Hypertension   ? Liver cirrhosis secondary to NASH [facility])   ? Sleep apnea   ? Thyroid disease   ? ? ?Past Surgical History:  ?Procedure Laterality Date  ? ABDOMINAL HYSTERECTOMY    ? COLONOSCOPY WITH PROPOFOL N/A 10/26/2021  ? Procedure: COLONOSCOPY WITH PROPOFOL;  Surgeon: Harvel Quale, MD;  [facility];  Service: Gastroenterology;  Laterality: N/A;  10:50  ? ESOPHAGOGASTRODUODENOSCOPY (EGD) WITH PROPOFOL N/A 10/26/2021  ? Procedure: ESOPHAGOGASTRODUODENOSCOPY (EGD) WITH PROPOFOL;  Surgeon: Harvel Quale, MD;  [facility];  Service: Gastroenterology;  Laterality: N/A;  ? HEMOSTASIS CLIP PLACEMENT  10/26/2021  ? Procedure: HEMOSTASIS CLIP PLACEMENT;  Surgeon: Montez Morita, Quillian Quince, MD;  [facility];  Service: Gastroenterology;;  ? JOINT REPLACEMENT Bilateral   ? bilateral thumb joint replacement  ? POLYPECTOMY  10/26/2021  ? Procedure: POLYPECTOMY;  Surgeon: Harvel Quale, MD;   [facility];  Service: Gastroenterology;;  gastric  ? THUMB ARTHROSCOPY    ? ? ?Family History  ?Problem Relation Age of Onset  ? Hypertension Mother   ? Thyroid disease Mother   ? Diabetes Mother   ? Heart failure Mother   ? ?Social History:  reports that she has quit smoking. She has never used smokeless tobacco. She reports that she does not drink alcohol and does not use drugs. ? ?Allergies:  ?Allergies  ?Allergen Reactions  ? Prednisone Other (See Comments)  ?  Mental Status Changes (intolerance), Patient cannot recall this reaction, but recalls feet hurting alfterwards ? ?  ? Tizanidine Other (See Comments)  ?  Mental Status Changes (intolerance),Cannot recall this reaction ? ?  ? Chocolate Flavor Other (See Comments)  ?   ?Migraine headache ?  ? Lorazepam Other (See Comments)  ?  Feels like "bugs are crawling all over me." ?Emotional and crying, stayed in bed. ?  ? Other Rash  ?  Plastic tape ?Prefers paper tape ?  ? Tramadol Other (See Comments)  ?  "feels like bugs crawling on me"  ? ? ?Medications Prior to Admission  ?Medication Sig Dispense Refill  ? ACCU-CHEK GUIDE test strip     ? ascorbic acid (VITAMIN C) 500 MG tablet Take 500 mg by mouth daily.    ? Cholecalciferol 50 MCG (2000 UT) CAPS Take 2,000 Units by mouth daily.    ? cyanocobalamin 1000 MCG tablet Take 1,000 mcg by mouth daily.    ? ferrous sulfate 325 (65 FE) MG tablet Take 325 mg by mouth daily with breakfast.    ?  folic acid (FOLVITE) 1 MG tablet Take 1 mg by mouth daily.    ? levothyroxine (SYNTHROID) 100 MCG tablet Take 100 mcg by mouth daily before breakfast.    ? lisinopril (PRINIVIL,ZESTRIL) 2.5 MG tablet Take 2.5 mg by mouth daily.    ? metFORMIN (GLUCOPHAGE) 1000 MG tablet Take 1,000 mg by mouth 2 (two) times daily with a meal.    ? pantoprazole (PROTONIX) 40 MG tablet Take 40 mg by mouth 2 (two) times daily.    ? PARoxetine (PAXIL) 10 MG tablet Take 10 mg by mouth daily.    ? pregabalin (LYRICA) 300 MG capsule Take 300  mg by mouth 2 (two) times daily.    ? propranolol (INDERAL) 40 MG tablet Take 40 mg by mouth 2 (two) times daily.    ? rosuvastatin (CRESTOR) 5 MG tablet Take 5 mg by mouth every evening.    ? spironolactone (ALDACTONE) 25 MG tablet Take 25 mg by mouth 2 (two) times daily.    ? topiramate (TOPAMAX) 50 MG tablet Take 50 mg by mouth 2 (two) times daily.    ? vitamin E 180 MG (400 UNITS) capsule Take 400 Units by mouth daily.    ? zinc gluconate 50 MG tablet Take 50 mg by mouth daily.    ? Garlic 5621 MG CAPS Take 1,000 mg by mouth daily.    ? ? ?Results for orders placed or performed during the hospital encounter of 12/27/21 (from the past 48 hour(s))  ?Glucose, capillary     Status: Abnormal  ? Collection Time: 12/27/21  6:54 AM  ?Result Value Ref Range  ? Glucose-Capillary 138 (H) 70 - 99 mg/dL  ?  Comment: Glucose reference range applies only to samples taken after fasting for at least 8 hours.  ? ?No results found. ? ?Review of Systems  ?Constitutional: Negative.   ?HENT: Negative.    ?Eyes: Negative.   ?Respiratory: Negative.    ?Cardiovascular: Negative.   ?Gastrointestinal: Negative.   ?Endocrine: Negative.   ?Genitourinary: Negative.   ?Musculoskeletal: Negative.   ?Skin: Negative.   ?Allergic/Immunologic: Negative.   ?Neurological: Negative.   ?Hematological: Negative.   ?Psychiatric/Behavioral: Negative.    ? ?Blood pressure 133/74, pulse 66, temperature 97.7 ?F (36.5 ?C), temperature source Oral, resp. rate (!) 21, height 5' 5"  (1.651 m), weight 105.8 kg, SpO2 96 %. ?Physical Exam  ?GENERAL: The patient is AO x3, in no acute distress. ?HEENT: Head is normocephalic and atraumatic. EOMI are intact. Mouth is well hydrated and without lesions. ?NECK: Supple. No masses ?LUNGS: Clear to auscultation. No presence of rhonchi/wheezing/rales. Adequate chest expansion ?HEART: RRR, normal s1 and s2. ?ABDOMEN: Soft, nontender, no guarding, no peritoneal signs, and nondistended. BS +. No masses. ?EXTREMITIES: Without any  cyanosis, clubbing, rash, lesions or edema. ?NEUROLOGIC: AOx3, no focal motor deficit. ?SKIN: no jaundice, no rashes ? ?Assessment/Plan ?56 y/o F with PMH past medical history of arthritis, DM, high cholesterol, HTN, thyroid disease, NASH cirrhosis, esophageal varices, gastric antral vascular ectasia, who comes to the hospital for follow up after partial resection of gastric polyp.  We will proceed with EGD today. ? ?Harvel Quale, MD ?12/27/2021, 7:21 AM ? ? ? ?

## 2021-12-28 LAB — BPAM PLATELET PHERESIS
Blood Product Expiration Date: 202303292359
ISSUE DATE / TIME: 202303280946
Unit Type and Rh: 6200

## 2021-12-28 LAB — PREPARE PLATELET PHERESIS: Unit division: 0

## 2021-12-28 LAB — SURGICAL PATHOLOGY

## 2021-12-29 ENCOUNTER — Ambulatory Visit: Payer: Medicare HMO | Admitting: Nurse Practitioner

## 2022-01-02 ENCOUNTER — Ambulatory Visit (INDEPENDENT_AMBULATORY_CARE_PROVIDER_SITE_OTHER): Payer: Medicare HMO | Admitting: Gastroenterology

## 2022-01-26 ENCOUNTER — Encounter (HOSPITAL_COMMUNITY): Payer: Self-pay | Admitting: Gastroenterology

## 2022-03-20 ENCOUNTER — Encounter (INDEPENDENT_AMBULATORY_CARE_PROVIDER_SITE_OTHER): Payer: Self-pay | Admitting: Gastroenterology

## 2022-03-20 ENCOUNTER — Ambulatory Visit (INDEPENDENT_AMBULATORY_CARE_PROVIDER_SITE_OTHER): Payer: Medicare HMO | Admitting: Gastroenterology

## 2022-03-20 VITALS — BP 107/73 | HR 67 | Temp 97.5°F | Ht 65.0 in | Wt 228.7 lb

## 2022-03-20 DIAGNOSIS — D500 Iron deficiency anemia secondary to blood loss (chronic): Secondary | ICD-10-CM | POA: Diagnosis not present

## 2022-03-20 DIAGNOSIS — K7581 Nonalcoholic steatohepatitis (NASH): Secondary | ICD-10-CM | POA: Diagnosis not present

## 2022-03-20 DIAGNOSIS — K529 Noninfective gastroenteritis and colitis, unspecified: Secondary | ICD-10-CM | POA: Diagnosis not present

## 2022-03-20 DIAGNOSIS — K7460 Unspecified cirrhosis of liver: Secondary | ICD-10-CM

## 2022-03-20 DIAGNOSIS — Z8719 Personal history of other diseases of the digestive system: Secondary | ICD-10-CM | POA: Diagnosis not present

## 2022-03-20 DIAGNOSIS — D509 Iron deficiency anemia, unspecified: Secondary | ICD-10-CM | POA: Insufficient documentation

## 2022-03-20 NOTE — Patient Instructions (Addendum)
-   Perform blood workup - Perform stool workup - Can take Imodium every day to decrease diarrhea episodes if >3 bowel movements per day - Schedule liver US - Reduce salt intake to <2 g per day - Can take Tylenol max of 2 g per day (650 mg q8h) for pain - Avoid NSAIDs for pain - Avoid eating raw oysters/shellfish - Protein shake (Ensure or Boost) every night before going to sleep

## 2022-03-20 NOTE — Progress Notes (Signed)
Priscilla Priscilla Roberts, M.D. Gastroenterology & Hepatology [facility] [address] [place], [place] [address]  Primary Care Physician: Priscilla Burly, MD [place] [id number]  I will communicate my assessment and recommendations to the referring MD via EMR.  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 Priscilla Roberts with past medical history of Priscilla Priscilla Roberts cirrhosis complicated by nonbleeding esophageal varices, arthritis, hyperlipidemia, diabetes, hypertension, hypothyroidism, OSA and iron deficiency anemia, who presents for follow up of liver cirrhosis.  The patient was last seen on 09/19/2021. At that time, the patient was called to undergo EGD and colonoscopy with finding described below.  She was advised to continue Aldactone 25 mg twice daily for lower extremity edema.  Patient reports that for the last year she has presented recurrent episodes of diarrhea. She reports that she used to have a bowel movement on a daily basis but now she has 3-4 Bms after she wakes up, which she describes as soft Bms without melena or hematocheiza. She takes store brand antdiarrheal, which she only takes as needed but not on a daily basis. Occasionally has nausea.  The patient denies having any vomiting, fever, chills, hematochezia, melena, hematemesis, abdominal distention, abdominal pain, jaundice, pruritus. No weight loss.   Patient reports that she was referred for transplant evaluation at [facility] with a doctor that works at [employer] and she was told she was not a liver transplant candidate.  No records of these are available.  Cirrhosis related questions: Hematemesis/coffee ground emesis: No History of variceal bleeding: No Abdominal pain: No Abdominal distention/worsening ascitesNo Fever/chills: No Episodes  of confusion/disorientation: No Number of daily bowel movements:3-4 Taking diuretics?: is on spironolactone 25 mg twice daily for lower extremity edema Prior history of banding?:  Yes having 2016 but not due to active bleeding, currently on propranolol 40 mg twice a day Prior episodes of SBP: No Last time liver imaging was performed:09/29/21 - [place] no masses in liver, cholelithiasis MELD score: 09/2022 - 10  She is supposed to have IV iron infusion for persistent iron def anemia but is waiting for authorization to proceed with this.  She is following at [facility] for her anemia and thrombocytopenia.  Most recent blood testing performed on 03/10/2022 showed a CBC with a hemoglobin of 11.7, although cell count of 3.6, platelets of 50,000, iron 41, iron saturation 15%, ferritin of 34, CMP with sodium 140, potassium 4.9, creatinine 1.12, total bilirubin 0.3, AST 28, ALT 33, albumin 4.1.  Last EGD: 12/27/2021 A single 4 mm mucosal nodule with a localized distribution was found in the upper third of the esophagus, 21 cm from the incisors. It appeared to have a papilloma appearance. Biopsies were taken with a cold forceps for histology. Grade I varices were found in the lower third of the esophagus. Multiple 4 to 15 mm pedunculated and sessile polyps with no bleeding and stigmata of recent bleeding were found in the entire examined stomach. Two polyps were removed with a hot snare - one of the polyps had a marked inflammatory appearance and a second polyp had a large size, close to 15 mm which was the polyp that was partially resected during most recent EGD. Resection and retrieval were complete. To prevent bleeding after the polypectomy, three hemostatic clips were successfully placed (two in the largest polypectomy area). There was no bleeding at  the end of the procedure. The examined duodenum was normal.  Path FINAL MICROSCOPIC DIAGNOSIS:   A. STOMACH, POLYPECTOMY:  - Fragments of hyperplastic  gastric polyp with focal erosion and  granulation tissue.  - No dysplasia or malignancy.   B. ESOPHAGUS, NODULE, BIOPSY:  - Squamous papilloma.  - No dysplasia or malignancy.  Last Colonoscopy: 10/26/2021 The perianal and digital rectal examinations were normal. A 5 mm polyp was found in the transverse colon. The polyp was sessile. The polyp was removed with a cold snare. Resection and retrieval were complete. A single small localized angiodysplastic lesion without bleeding was found in the sigmoid colon. Non-bleeding internal hemorrhoids were found during retroflexion. The hemorrhoids were small.  Path: COLON, TRANSVERSE, POLYPECTOMY:  - Tubular adenoma.  - Negative for high grade dysplasia and malignancy.   Recommended repeat colonoscopy in 5 years  Past Medical History: Past Medical History:  Diagnosis Date   Arthritis    Cancer ([place])    Diabetes mellitus without complication ([place])    High cholesterol    Hypertension    Liver cirrhosis secondary to NASH ([place])    Sleep apnea    Thyroid disease     Past Surgical History: Past Surgical History:  Procedure Laterality Date   ABDOMINAL HYSTERECTOMY     BIOPSY  12/27/2021   Procedure: BIOPSY;  Surgeon: Priscilla Quale, MD;  [facility];  Service: Gastroenterology;;   COLONOSCOPY WITH PROPOFOL N/A 10/26/2021   Procedure: COLONOSCOPY WITH PROPOFOL;  Surgeon: Priscilla Quale, MD;  [facility];  Service: Gastroenterology;  Laterality: N/A;  10:50   ESOPHAGOGASTRODUODENOSCOPY (EGD) WITH PROPOFOL N/A 10/26/2021   Procedure: ESOPHAGOGASTRODUODENOSCOPY (EGD) WITH PROPOFOL;  Surgeon: Priscilla Quale, MD;  [facility];  Service: Gastroenterology;  Laterality: N/A;   ESOPHAGOGASTRODUODENOSCOPY (EGD) WITH PROPOFOL N/A 12/27/2021   Procedure: ESOPHAGOGASTRODUODENOSCOPY (EGD) WITH PROPOFOL;  Surgeon: Priscilla Quale, MD;  [facility];  Service:  Gastroenterology;  Laterality: N/A;  7:30 / Per Dr Priscilla Priscilla Roberts needs a 1 hour time slot / follow up (Recall)   HEMOSTASIS CLIP PLACEMENT  10/26/2021   Procedure: HEMOSTASIS CLIP PLACEMENT;  Surgeon: Priscilla Quale, MD;  [facility];  Service: Gastroenterology;;   HEMOSTASIS CLIP PLACEMENT  12/27/2021   Procedure: HEMOSTASIS CLIP PLACEMENT;  Surgeon: Priscilla Quale, MD;  [facility];  Service: Gastroenterology;;   JOINT REPLACEMENT Bilateral    bilateral thumb joint replacement   POLYPECTOMY  10/26/2021   Procedure: POLYPECTOMY;  Surgeon: Priscilla Quale, MD;  [facility];  Service: Gastroenterology;;  gastric   POLYPECTOMY  12/27/2021   Procedure: POLYPECTOMY INTESTINAL;  Surgeon: Priscilla Quale, MD;  [facility];  Service: Gastroenterology;;   THUMB ARTHROSCOPY      Family History: Family History  Problem Relation Age of Onset   Hypertension Mother    Thyroid disease Mother    Diabetes Mother    Heart failure Mother     Social History: Social History   Tobacco Use  Smoking Status Former   Passive exposure: Past  Smokeless Tobacco Never   Social History   Substance and Sexual Activity  Alcohol Use No   Social History   Substance and Sexual Activity  Drug Use No    Allergies: Allergies  Allergen Reactions   Prednisone Other (See Comments)    Mental Status Changes (intolerance), Patient cannot recall this reaction, but recalls feet hurting alfterwards  Tizanidine Other (See Comments)    Mental Status Changes (intolerance),Cannot recall this reaction     Chocolate Flavor Other (See Comments)     Migraine headache    Lorazepam Other (See Comments)    Feels like "bugs are crawling all over me." Emotional and crying, stayed in bed.    Other Rash    Plastic tape Prefers paper tape    Tramadol Other (See Comments)    "feels like bugs crawling on me"     Medications: Current Outpatient Medications  Medication Sig Dispense Refill   ACCU-CHEK GUIDE test strip      cyanocobalamin 1000 MCG tablet Take 1,000 mcg by mouth daily.     ferrous sulfate 325 (65 FE) MG tablet Take 325 mg by mouth daily with breakfast.     folic acid (FOLVITE) 1 MG tablet Take 1 mg by mouth daily.     Garlic 6811 MG CAPS Take 1,000 mg by mouth daily.     levothyroxine (SYNTHROID) 100 MCG tablet Take 100 mcg by mouth daily before breakfast.     lisinopril (PRINIVIL,ZESTRIL) 2.5 MG tablet Take 2.5 mg by mouth daily.     metFORMIN (GLUCOPHAGE) 1000 MG tablet Take 1,000 mg by mouth 2 (two) times daily with a meal.     Omega-3 Fatty Acids (FISH OIL) 1000 MG CAPS Take by mouth. One tid     pantoprazole (PROTONIX) 40 MG tablet Take 40 mg by mouth 2 (two) times daily.     PARoxetine (PAXIL) 10 MG tablet Take 10 mg by mouth daily.     pregabalin (LYRICA) 300 MG capsule Take 300 mg by mouth 2 (two) times daily.     propranolol (INDERAL) 40 MG tablet Take 40 mg by mouth 2 (two) times daily.     rosuvastatin (CRESTOR) 5 MG tablet Take 5 mg by mouth every evening.     spironolactone (ALDACTONE) 25 MG tablet Take 25 mg by mouth 2 (two) times daily.     topiramate (TOPAMAX) 50 MG tablet Take 50 mg by mouth 2 (two) times daily.     No current facility-administered medications for this visit.    Review of Systems: GENERAL: negative for malaise, night sweats HEENT: No changes in hearing or vision, no nose bleeds or other nasal problems. NECK: Negative for lumps, goiter, pain and significant neck swelling RESPIRATORY: Negative for cough, wheezing CARDIOVASCULAR: Negative for chest pain, leg swelling, palpitations, orthopnea GI: SEE HPI MUSCULOSKELETAL: Negative for joint pain or swelling, back pain, and muscle pain. SKIN: Negative for lesions, rash PSYCH: Negative for sleep disturbance, mood disorder and recent psychosocial stressors. HEMATOLOGY Negative for prolonged  bleeding, bruising easily, and swollen nodes. ENDOCRINE: Negative for cold or heat intolerance, polyuria, polydipsia and goiter. NEURO: negative for tremor, gait imbalance, syncope and seizures. The remainder of the review of systems is noncontributory.   Physical Exam: BP 107/73 (BP Location: Left Arm, Patient Position: Sitting, Cuff Size: Large)   Pulse 67   Temp (!) 97.5 F (36.4 C) (Oral)   Ht 5' 5"  (1.651 m)   Wt 228 lb 11.2 oz (103.7 kg)   BMI 38.06 kg/m  GENERAL: The patient is AO x3, in no acute distress. HEENT: Head is normocephalic and atraumatic. EOMI are intact. Mouth is well hydrated and without lesions. NECK: Supple. No masses LUNGS: Clear to auscultation. No presence of rhonchi/wheezing/rales. Adequate chest expansion HEART: RRR, normal s1 and s2. ABDOMEN: Soft, nontender, no guarding, no peritoneal signs, and nondistended. BS +. No masses.  EXTREMITIES: Without any cyanosis, clubbing, rash, lesions or edema. NEUROLOGIC: AOx3, no focal motor deficit. SKIN: no jaundice, no rashes  Imaging/Labs: as above  I personally reviewed and interpreted the available labs, imaging and endoscopic files.  Impression and Plan: Priscilla Priscilla Roberts is a 56 y.o. Priscilla Roberts with past medical history of Priscilla Priscilla Roberts cirrhosis complicated by nonbleeding esophageal varices, arthritis, hyperlipidemia, diabetes, hypertension, hypothyroidism, OSA and iron deficiency anemia, who presents for follow up of liver cirrhosis.  The patient has presented a relatively indolent liver cirrhosis which has only presented evidence of small esophageal varices and was possibly banded once in the past.  She has presented relatively low MELD score.  If she were to need evaluation for liver transplant, I consider she is an adequate candidate which I discussed with her and her husband.  We will update the rest of her MELD labs and AFP, we will also proceed with ultrasound of her right upper quadrant for [place] screening.  Finally,  she has presented recurrent chronic diarrhea of unclear etiology.  Given her history of diabetes, we will rule out EPI with stool testing and celiac serology.  She can take Imodium as needed for diarrhea.  -Check INR, AFP and celiac serology -Check fecal fat and fecal elastase - Can take Imodium every day to decrease diarrhea episodes if >3 bowel movements per day - Schedule liver US - Reduce salt intake to <2 g per day - Can take Tylenol max of 2 g per day (650 mg q8h) for pain - Avoid NSAIDs for pain - Avoid eating raw oysters/shellfish - Protein shake (Ensure or Boost) every night before going to sleep  All questions were answered.      Priscilla Quale, MD Gastroenterology and Hepatology [facility]

## 2022-03-22 LAB — CELIAC DISEASE PANEL
(tTG) Ab, IgA: <1 U/mL
(tTG) Ab, IgG: <1 U/mL
Gliadin IgA: 1.7 U/mL
Gliadin IgG: <1 U/mL
Immunoglobulin A: 237 mg/dL (ref 47–310)

## 2022-03-22 LAB — PROTIME-INR
INR: 1
Prothrombin Time: 11 s (ref 9.0–11.5)

## 2022-03-22 LAB — AFP TUMOR MARKER: AFP-Tumor Marker: 2.2 ng/mL

## 2022-03-29 LAB — PANCREATIC ELASTASE, FECAL: Pancreatic Elastase-1, Stool: >500 mcg/g

## 2022-03-29 LAB — GASTROINTESTINAL PATHOGEN PNL
CampyloBacter Group: NOT DETECTED
Norovirus GI/GII: NOT DETECTED
Rotavirus A: NOT DETECTED
Salmonella species: NOT DETECTED
Shiga Toxin 1: NOT DETECTED
Shiga Toxin 2: NOT DETECTED
Shigella Species: NOT DETECTED
Vibrio Group: NOT DETECTED
Yersinia enterocolitica: NOT DETECTED

## 2022-03-29 LAB — FECAL FAT, QUALITATIVE: FECAL FAT, QUALITATIVE: NORMAL

## 2022-03-30 ENCOUNTER — Ambulatory Visit (HOSPITAL_COMMUNITY)
Admission: RE | Admit: 2022-03-30 | Discharge: 2022-03-30 | Disposition: A | Discharge status: Home / Self Care | Payer: Medicare HMO | Source: Ambulatory Visit | Attending: Gastroenterology | Admitting: Gastroenterology

## 2022-03-30 DIAGNOSIS — K7581 Nonalcoholic steatohepatitis (NASH): Secondary | ICD-10-CM | POA: Diagnosis present

## 2022-03-30 DIAGNOSIS — K7460 Unspecified cirrhosis of liver: Secondary | ICD-10-CM | POA: Diagnosis present

## 2022-04-02 ENCOUNTER — Emergency Department (HOSPITAL_COMMUNITY)
Admission: EM | Admit: 2022-04-02 | Discharge: 2022-04-02 | Disposition: A | Discharge status: Home / Self Care | Payer: Medicare HMO | Attending: Emergency Medicine | Admitting: Emergency Medicine

## 2022-04-02 ENCOUNTER — Other Ambulatory Visit: Payer: Self-pay

## 2022-04-02 ENCOUNTER — Encounter (HOSPITAL_COMMUNITY): Payer: Self-pay

## 2022-04-02 ENCOUNTER — Emergency Department (HOSPITAL_COMMUNITY): Payer: Medicare HMO

## 2022-04-02 DIAGNOSIS — E119 Type 2 diabetes mellitus without complications: Secondary | ICD-10-CM | POA: Diagnosis not present

## 2022-04-02 DIAGNOSIS — D649 Anemia, unspecified: Secondary | ICD-10-CM | POA: Insufficient documentation

## 2022-04-02 DIAGNOSIS — K625 Hemorrhage of anus and rectum: Secondary | ICD-10-CM | POA: Diagnosis present

## 2022-04-02 DIAGNOSIS — K7581 Nonalcoholic steatohepatitis (NASH): Secondary | ICD-10-CM | POA: Diagnosis not present

## 2022-04-02 DIAGNOSIS — K429 Umbilical hernia without obstruction or gangrene: Secondary | ICD-10-CM | POA: Diagnosis not present

## 2022-04-02 DIAGNOSIS — I10 Essential (primary) hypertension: Secondary | ICD-10-CM | POA: Diagnosis not present

## 2022-04-02 DIAGNOSIS — I700 Atherosclerosis of aorta: Secondary | ICD-10-CM | POA: Insufficient documentation

## 2022-04-02 DIAGNOSIS — R109 Unspecified abdominal pain: Secondary | ICD-10-CM | POA: Diagnosis present

## 2022-04-02 DIAGNOSIS — K7460 Unspecified cirrhosis of liver: Secondary | ICD-10-CM | POA: Insufficient documentation

## 2022-04-02 DIAGNOSIS — R161 Splenomegaly, not elsewhere classified: Secondary | ICD-10-CM | POA: Insufficient documentation

## 2022-04-02 DIAGNOSIS — Z79899 Other long term (current) drug therapy: Secondary | ICD-10-CM | POA: Insufficient documentation

## 2022-04-02 DIAGNOSIS — Z7984 Long term (current) use of oral hypoglycemic drugs: Secondary | ICD-10-CM | POA: Insufficient documentation

## 2022-04-02 DIAGNOSIS — K8020 Calculus of gallbladder without cholecystitis without obstruction: Secondary | ICD-10-CM | POA: Insufficient documentation

## 2022-04-02 LAB — DIFFERENTIAL
Abs Immature Granulocytes: 0.01 10*3/uL (ref 0.00–0.07)
Basophils Absolute: 0 10*3/uL (ref 0.0–0.1)
Basophils Relative: 1 %
Eosinophils Absolute: 0.2 10*3/uL (ref 0.0–0.5)
Eosinophils Relative: 4 %
Immature Granulocytes: 0 %
Lymphocytes Relative: 27 %
Lymphs Abs: 1 10*3/uL (ref 0.7–4.0)
Monocytes Absolute: 0.3 10*3/uL (ref 0.1–1.0)
Monocytes Relative: 9 %
Neutro Abs: 2.4 10*3/uL (ref 1.7–7.7)
Neutrophils Relative %: 59 %

## 2022-04-02 LAB — URINALYSIS, ROUTINE W REFLEX MICROSCOPIC
Bacteria, UA: NONE SEEN
Bilirubin Urine: NEGATIVE
Glucose, UA: NEGATIVE mg/dL
Ketones, ur: NEGATIVE mg/dL
Leukocytes,Ua: NEGATIVE
Nitrite: NEGATIVE
Protein, ur: NEGATIVE mg/dL
Specific Gravity, Urine: 1.005 (ref 1.005–1.030)
pH: 6 (ref 5.0–8.0)

## 2022-04-02 LAB — COMPREHENSIVE METABOLIC PANEL
ALT: 28 U/L (ref 0–44)
AST: 30 U/L (ref 15–41)
Albumin: 4.4 g/dL (ref 3.5–5.0)
Alkaline Phosphatase: 55 U/L (ref 38–126)
Anion gap: 8 (ref 5–15)
BUN: 23 mg/dL — ABNORMAL HIGH (ref 6–20)
CO2: 25 mmol/L (ref 22–32)
Calcium: 9.7 mg/dL (ref 8.9–10.3)
Chloride: 107 mmol/L (ref 98–111)
Creatinine, Ser: 1.01 mg/dL — ABNORMAL HIGH (ref 0.44–1.00)
GFR, Estimated: >60 mL/min (ref >60)
Glucose, Bld: 127 mg/dL — ABNORMAL HIGH (ref 70–99)
Potassium: 4.1 mmol/L (ref 3.5–5.1)
Sodium: 140 mmol/L (ref 135–145)
Total Bilirubin: 0.7 mg/dL (ref 0.3–1.2)
Total Protein: 8 g/dL (ref 6.5–8.1)

## 2022-04-02 LAB — TYPE AND SCREEN
ABO/RH(D): A POS
Antibody Screen: NEGATIVE

## 2022-04-02 LAB — CBC
HCT: 36.3 % (ref 36.0–46.0)
Hemoglobin: 11.6 g/dL — ABNORMAL LOW (ref 12.0–15.0)
MCH: 30.4 pg (ref 26.0–34.0)
MCHC: 32 g/dL (ref 30.0–36.0)
MCV: 95.3 fL (ref 80.0–100.0)
Platelets: 71 10*3/uL — ABNORMAL LOW (ref 150–400)
RBC: 3.81 MIL/uL — ABNORMAL LOW (ref 3.87–5.11)
RDW: 13.7 % (ref 11.5–15.5)
WBC: 3.9 10*3/uL — ABNORMAL LOW (ref 4.0–10.5)
nRBC: 0 % (ref 0.0–0.2)

## 2022-04-02 LAB — POC OCCULT BLOOD, ED: Fecal Occult Bld: POSITIVE — AB

## 2022-04-02 MED ORDER — IOHEXOL 300 MG/ML  SOLN
100.0000 mL | Freq: Once | INTRAMUSCULAR | Status: AC | PRN
  Administered 2022-04-02: 100 mL via INTRAVENOUS

## 2022-04-02 NOTE — Discharge Instructions (Signed)
Follow-up with your GI doctor early next week for reevaluation call call Monday to schedule appointment.  If he start having shortness of breath, worsening bleeding, severe abdominal pain or new or concerning symptoms return back to ED for further evaluation.

## 2022-04-02 NOTE — ED Triage Notes (Addendum)
Pt presents with bright red rectal bleeding today. Pt has hx of colon polyps, chronic blood loss, anemia. Pt reports mild rectal pain and R sided abd pain. Pt denies any ShOB or fatigue.

## 2022-04-02 NOTE — ED Provider Notes (Signed)
[facility] EMERGENCY DEPARTMENT Provider Note   CSN: [id number] Arrival date & time: 04/02/22  1400     History  Chief Complaint  Patient presents with   Rectal Bleeding    Priscilla Roberts is a 56 y.o. female.   Rectal Bleeding   Patient with medical history of hypertension, diabetes, hyperlipidemia, liver cirrhosis secondary to Karlene Roberts presents today due to rectal bleeding.  It started 3 days ago, its been increasing in frequency and amount of blood.  Slight red blood with some clots.  Also associated with some lower abdominal pain which is worse on the right.  Pain comes and goes, denies any presyncope or syncopal episodes.  Not fatigued, not having chest pain or shortness of breath.  Denies any hematemesis or vomiting, no nausea or urinary symptoms.  Home Medications Prior to Admission medications   Medication Sig Start Date End Date Taking? Authorizing Provider  ACCU-CHEK GUIDE test strip  09/30/21   [provider]  cyanocobalamin 1000 MCG tablet Take 1,000 mcg by mouth daily.    [provider]  ferrous sulfate 325 (65 FE) MG tablet Take 325 mg by mouth daily with breakfast. 01/08/20   [provider]  folic acid (FOLVITE) 1 MG tablet Take 1 mg by mouth daily.    [provider]  Garlic 2831 MG CAPS Take 1,000 mg by mouth daily.    [provider]  levothyroxine (SYNTHROID) 100 MCG tablet Take 100 mcg by mouth daily before breakfast.    [provider]  lisinopril (PRINIVIL,ZESTRIL) 2.5 MG tablet Take 2.5 mg by mouth daily.    [provider]  metFORMIN (GLUCOPHAGE) 1000 MG tablet Take 1,000 mg by mouth 2 (two) times daily with a meal.    [provider]  Omega-3 Fatty Acids (FISH OIL) 1000 MG CAPS Take by mouth. One tid    [provider]  pantoprazole (PROTONIX) 40 MG tablet Take 40 mg by mouth 2 (two) times daily. 09/04/19   [provider]  PARoxetine (PAXIL) 10 MG tablet Take 10 mg by  mouth daily. 09/04/19   [provider]  pregabalin (LYRICA) 300 MG capsule Take 300 mg by mouth 2 (two) times daily. 03/16/20   [provider]  propranolol (INDERAL) 40 MG tablet Take 40 mg by mouth 2 (two) times daily. 12/02/18   [provider]  rosuvastatin (CRESTOR) 5 MG tablet Take 5 mg by mouth every evening. 09/30/19   [provider]  spironolactone (ALDACTONE) 25 MG tablet Take 25 mg by mouth 2 (two) times daily.    [provider]  topiramate (TOPAMAX) 50 MG tablet Take 50 mg by mouth 2 (two) times daily.    [provider]      Allergies    Prednisone, Tizanidine, Chocolate flavor, Lorazepam, Other, and Tramadol    Review of Systems   Review of Systems  Gastrointestinal:  Positive for hematochezia.    Physical Exam Updated Vital Signs BP 119/61   Pulse (!) 59   Temp (!) 97.5 F (36.4 C) (Oral)   Resp 17   Ht 5' 5"  (1.651 m)   Wt 100.7 kg   SpO2 97%   BMI 36.94 kg/m  Physical Exam Vitals and nursing note reviewed. Exam conducted with a chaperone present.  Constitutional:      Appearance: Normal appearance.  HENT:     Head: Normocephalic and atraumatic.  Eyes:     General: No scleral icterus.  Right eye: No discharge.        Left eye: No discharge.     Extraocular Movements: Extraocular movements intact.     Pupils: Pupils are equal, round, and reactive to light.  Cardiovascular:     Rate and Rhythm: Normal rate and regular rhythm.     Pulses: Normal pulses.     Heart sounds: Normal heart sounds. No murmur heard.    No friction rub. No gallop.  Pulmonary:     Effort: Pulmonary effort is normal. No respiratory distress.     Breath sounds: Normal breath sounds.  Abdominal:     General: Abdomen is flat. Bowel sounds are normal. There is no distension.     Palpations: Abdomen is soft.     Tenderness: There is abdominal tenderness.     Comments: Right lower quadrant and suprapubic tenderness.  Abdomen  soft without rigidity or guarding.  Genitourinary:    Comments: Rectal exam performed chaperone in room.  Patient had tenderness on rectal exam, normal stool with tinges of bright red blood.  No actively bleeding hemorrhoids or masses palpated. Skin:    General: Skin is warm and dry.     Coloration: Skin is not jaundiced.  Neurological:     Mental Status: She is alert. Mental status is at baseline.     Coordination: Coordination normal.     ED Results / Procedures / Treatments   Labs (all labs ordered are listed, but only abnormal results are displayed) Labs Reviewed  COMPREHENSIVE METABOLIC PANEL - Abnormal; Notable for the following components:      Result Value   Glucose, Bld 127 (*)    BUN 23 (*)    Creatinine, Ser 1.01 (*)    All other components within normal limits  CBC - Abnormal; Notable for the following components:   WBC 3.9 (*)    RBC 3.81 (*)    Hemoglobin 11.6 (*)    Platelets 71 (*)    All other components within normal limits  URINALYSIS, ROUTINE W REFLEX MICROSCOPIC - Abnormal; Notable for the following components:   Color, Urine STRAW (*)    Hgb urine dipstick SMALL (*)    All other components within normal limits  POC OCCULT BLOOD, ED - Abnormal; Notable for the following components:   Fecal Occult Bld POSITIVE (*)    All other components within normal limits  DIFFERENTIAL  POC OCCULT BLOOD, ED  TYPE AND SCREEN    EKG None  Radiology CT Abdomen Pelvis W Contrast  Result Date: 04/02/2022 CLINICAL DATA:  Bright red rectal bleeding today, mild rectal pain, history of colonic polyps, chronic blood loss anemia EXAM: CT ABDOMEN AND PELVIS WITH CONTRAST TECHNIQUE: Multidetector CT imaging of the abdomen and pelvis was performed using the standard protocol following bolus administration of intravenous contrast. RADIATION DOSE REDUCTION: This exam was performed according to the departmental dose-optimization program which includes automated exposure control,  adjustment of the mA and/or kV according to patient size and/or use of iterative reconstruction technique. CONTRAST:  182m OMNIPAQUE IOHEXOL 300 MG/ML SOLN IV. No oral contrast. COMPARISON:  None Available. FINDINGS: Lower chest: Lung bases clear Hepatobiliary: Cirrhotic liver without mass. Contracted gallbladder with calcified gallstones. Pancreas: Normal appearance Spleen: Splenic enlargement, spleen 12.5 x 6.5 x 15.6 cm (volume = 660 cm^3). No focal mass. Adrenals/Urinary Tract: Adrenal glands, kidneys, ureters, and bladder normal appearance Stomach/Bowel: Normal appendix. Stomach and bowel loops normal appearance. Vascular/Lymphatic: Atherosclerotic calcifications aorta and iliac arteries without aneurysm. Vascular structures  patent. No adenopathy. Reproductive: Uterus surgically absent.  Normal appearing ovaries. Other: Umbilical hernia containing fat.  No free air or free fluid. Musculoskeletal: Unremarkable IMPRESSION: Cirrhotic liver with splenomegaly. Cholelithiasis. Umbilical hernia containing fat. No acute intra-abdominal or intrapelvic abnormalities. Aortic Atherosclerosis (ICD10-I70.0). Electronically Signed   By: Lavonia Dana M.D.   On: 04/02/2022 17:12    Procedures Procedures    Medications Ordered in ED Medications  iohexol (OMNIPAQUE) 300 MG/ML solution 100 mL (100 mLs Intravenous Contrast Given 04/02/22 1647)    ED Course/ Medical Decision Making/ A&P                           Medical Decision Making Amount and/or Complexity of Data Reviewed Labs: ordered. Radiology: ordered.  Risk Prescription drug management.   Patient presents due to rectal bleeding.  Differential includes but not limited to GI bleeding, diverticular bleed, esophageal variceal bleed, mesenteric ischemia, colon polyps, anemia, sepsis/hemorrhagic shock.  Physical exam is not revealing.  There is some mild abdominal tenderness but no peritoneal signs.  There is brown stool with traces of bright red blood on  fecal occult which was positive.  Patient's blood pressure is stable she is not hypotensive or tachycardic.  Given patient is passing clots it is bright red eyes have a higher suspicion this is a lower GI bleed rather than upper GI bleed.  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  Most recent GI bleed was due to gastric polyps and grade 1 varices that were clipped during EGD on 12/27/2021.  Last colonoscopy was in 10/26/2021 views found to have polyps, sigmoid AVMs and hemorrhoids.  I ordered and viewed laboratory work-up.  Patient is mildly anemic with a hemoglobin of 11.6 which actually increased from 3 months ago it was 7.4.  Thrombocytopenic at 40 which is also increased compared to previous.  White count slightly decreased at 3.9.  CMP is without any gross electrolyte derangement or AKI.  There is a slight elevation of BUN at 23.  CT abdomen pelvis ordered and viewed by myself.  No acute process, agree with radiologist interpretation.  Patient has been observed on cardiac monitoring.  He has been in sinus rhythm during the stay.  Currently 77 bpm.  Repeat abdominal exam is benign.  Considered admission but patient is hemodynamically stable with decrease to the low to lower GI bleed.  She is close follow-up available with gastroenterology, and her PCP anemia has been worked up previously and is not acute or new.  I discussed return precautions with the patient and her spouse and they verbalized understanding and agreement to the plan.        Final Clinical Impression(s) / ED Diagnoses Final diagnoses:  Rectal bleeding    Rx / DC Orders ED Discharge Orders     None         Sherrill Raring, PA-C 04/02/22 1753    Isla Pence, MD 04/03/22 [id number]

## 2022-04-05 ENCOUNTER — Encounter (INDEPENDENT_AMBULATORY_CARE_PROVIDER_SITE_OTHER): Payer: Self-pay | Admitting: Gastroenterology

## 2022-04-05 ENCOUNTER — Ambulatory Visit (INDEPENDENT_AMBULATORY_CARE_PROVIDER_SITE_OTHER): Payer: Medicare HMO | Admitting: Gastroenterology

## 2022-04-05 VITALS — BP 124/77 | HR 71 | Temp 97.3°F | Ht 67.0 in | Wt 227.7 lb

## 2022-04-05 DIAGNOSIS — K625 Hemorrhage of anus and rectum: Secondary | ICD-10-CM

## 2022-04-05 DIAGNOSIS — K7460 Unspecified cirrhosis of liver: Secondary | ICD-10-CM

## 2022-04-05 DIAGNOSIS — K529 Noninfective gastroenteritis and colitis, unspecified: Secondary | ICD-10-CM

## 2022-04-05 DIAGNOSIS — R1031 Right lower quadrant pain: Secondary | ICD-10-CM

## 2022-04-05 DIAGNOSIS — K7581 Nonalcoholic steatohepatitis (NASH): Secondary | ICD-10-CM

## 2022-04-05 MED ORDER — DICYCLOMINE HCL 10 MG PO CAPS: 10.0000 mg | ORAL_CAPSULE | Freq: Two times a day (BID) | ORAL | 0 refills | Status: DC | PRN

## 2022-04-05 NOTE — Progress Notes (Signed)
Priscilla Roberts, M.D. Gastroenterology & Hepatology [facility] [address] [place], [place] [address]  Primary Care Physician: Neale Burly, MD [place] [address]  I will communicate my assessment and recommendations to the referring MD via EMR.  Problems: NASH cirrhosis Grade 1 esophageal varices, history of previous banding Large hyperplastic gastric polyps History of GAVE Chronic diarrhea Rectal bleeding  History of Present Illness: Priscilla Roberts is a 56 y.o. female with past medical history of [name] cirrhosis complicated by nonbleeding esophageal varices, arthritis, hyperlipidemia, diabetes, hypertension, hypothyroidism, OSA and iron deficiency anemia, who presents for follow up of rectal bleeding.  The patient was last seen on 03/20/2022. At that time, the patient was ordered to have stool testing (pancreatic elastase, fecal fat and GI pathogen panel) and celiac serology for evaluation of chronic diarrhea, which came back completely normal.   Patient reports that on 03/30/2022 she presented new onset of rectal bleeding and abdominal pain in her right lower quadrant, which she reports was all new in onset. She states that it became very severe on 04/02/2022 as she presented clots in her stool and a large amount of blood so she decided to come to the ER.   The patient went to the ER on 04/02/2022 after presenting episodes of rectal bleeding.  CBC showed a hemoglobin of 11.6 and platelet of 71,000, CMP showed a BUN of 23 and a creatinine of 1.01 (higher than baseline), she had positive fecal occult blood test.  CT of the abdomen pelvis with IV contrast was performed which showed presence of cirrhosis with splenomegaly and cholelithiasis but no other abnormalities.  The patient was sent home. States she saw a small amount of blood in her stool today but did not have any blood on Monday or Tuesday. She is still  having some RLQ pain, which is constant and described as a stabbing pain. Has not taken any medication for the pain.   Reports chronic history of nausea but no vomiting. No recent changes in the severity of vomiting.  She is still having same diarrhea, usually 30 minutes after having a meal.  The patient denies having any nausea, vomiting, fever, chills, melena, hematemesis, jaundice, pruritus or weight loss.  Cirrhosis related questions: Hematemesis/coffee ground emesis: No History of variceal bleeding: No Abdominal pain: yes Abdominal distention/worsening ascitesNo Fever/chills: No Episodes of confusion/disorientation: No Number of daily bowel movements:3-5 Taking diuretics?: is on spironolactone 25 mg twice daily for lower extremity edema Prior history of banding?:  Yes having 2016 but not due to active bleeding, currently on propranolol 40 mg twice a day Prior episodes of SBP: No Last time liver imaging was performed: 03/30/22- [place] no masses in liver, cholelithiasis MELD score: 03/2022 - 9  Last EGD: 12/27/2021 A single 4 mm mucosal nodule with a localized distribution was found in the upper third of the esophagus, 21 cm from the incisors. It appeared to have a papilloma appearance. Biopsies were taken with a cold forceps for histology. Grade I varices were found in the lower third of the esophagus. Multiple 4 to 15 mm pedunculated and sessile polyps with no bleeding and stigmata of recent bleeding were found in the entire examined stomach. Two polyps were removed with a hot snare - one of the polyps had a marked inflammatory appearance and a second polyp had a large size, close to 15 mm which was the polyp that was partially resected during most recent EGD. Resection  and retrieval were complete. To prevent bleeding after the polypectomy, three hemostatic clips were successfully placed (two in the largest polypectomy area). There was no bleeding at the end of the procedure. The  examined duodenum was normal.   Path FINAL MICROSCOPIC DIAGNOSIS:   A. STOMACH, POLYPECTOMY:  - Fragments of hyperplastic gastric polyp with focal erosion and  granulation tissue.  - No dysplasia or malignancy.   B. ESOPHAGUS, NODULE, BIOPSY:  - Squamous papilloma.  - No dysplasia or malignancy.   Last Colonoscopy: 10/26/2021 The perianal and digital rectal examinations were normal. A 5 mm polyp was found in the transverse colon. The polyp was sessile. The polyp was removed with a cold snare. Resection and retrieval were complete. A single small localized angiodysplastic lesion without bleeding was found in the sigmoid colon. Non-bleeding internal hemorrhoids were found during retroflexion. The hemorrhoids were small.   Path: COLON, TRANSVERSE, POLYPECTOMY:  - Tubular adenoma.  - Negative for high grade dysplasia and malignancy.    Recommended repeat colonoscopy in 5 years  Past Medical History: Past Medical History:  Diagnosis Date   Arthritis    Cancer ([place])    Diabetes mellitus without complication ([place])    High cholesterol    Hypertension    Liver cirrhosis secondary to NASH ([place])    Sleep apnea    Thyroid disease     Past Surgical History: Past Surgical History:  Procedure Laterality Date   ABDOMINAL HYSTERECTOMY     BIOPSY  12/27/2021   Procedure: BIOPSY;  Surgeon: Harvel Quale, MD;  [facility];  Service: Gastroenterology;;   COLONOSCOPY WITH PROPOFOL N/A 10/26/2021   Procedure: COLONOSCOPY WITH PROPOFOL;  Surgeon: Harvel Quale, MD;  [facility];  Service: Gastroenterology;  Laterality: N/A;  10:50   ESOPHAGOGASTRODUODENOSCOPY (EGD) WITH PROPOFOL N/A 10/26/2021   Procedure: ESOPHAGOGASTRODUODENOSCOPY (EGD) WITH PROPOFOL;  Surgeon: Harvel Quale, MD;  Location: [facility];  Service: Gastroenterology;  Laterality: N/A;   ESOPHAGOGASTRODUODENOSCOPY (EGD) WITH PROPOFOL N/A 12/27/2021   Procedure:  ESOPHAGOGASTRODUODENOSCOPY (EGD) WITH PROPOFOL;  Surgeon: Harvel Quale, MD;  [facility];  Service: Gastroenterology;  Laterality: N/A;  7:30 / Per Dr Jenetta Downer needs a 1 hour time slot / follow up (Recall)   HEMOSTASIS CLIP PLACEMENT  10/26/2021   Procedure: HEMOSTASIS CLIP PLACEMENT;  Surgeon: Harvel Quale, MD;  [facility];  Service: Gastroenterology;;   HEMOSTASIS CLIP PLACEMENT  12/27/2021   Procedure: HEMOSTASIS CLIP PLACEMENT;  Surgeon: Harvel Quale, MD;  [facility];  Service: Gastroenterology;;   JOINT REPLACEMENT Bilateral    bilateral thumb joint replacement   POLYPECTOMY  10/26/2021   Procedure: POLYPECTOMY;  Surgeon: Harvel Quale, MD;  [facility];  Service: Gastroenterology;;  gastric   POLYPECTOMY  12/27/2021   Procedure: POLYPECTOMY INTESTINAL;  Surgeon: Harvel Quale, MD;  [facility];  Service: Gastroenterology;;   THUMB ARTHROSCOPY      Family History: Family History  Problem Relation Age of Onset   Hypertension Mother    Thyroid disease Mother    Diabetes Mother    Heart failure Mother     Social History: Social History   Tobacco Use  Smoking Status Former   Passive exposure: Past  Smokeless Tobacco Never   Social History   Substance and Sexual Activity  Alcohol Use No   Social History   Substance and Sexual Activity  Drug Use No    Allergies:  Allergies  Allergen Reactions   Prednisone Other (See Comments)    Mental Status Changes (intolerance), Patient cannot recall this reaction, but recalls feet hurting alfterwards     Tizanidine Other (See Comments)    Mental Status Changes (intolerance),Cannot recall this reaction     Chocolate Flavor Other (See Comments)     Migraine headache    Lorazepam Other (See Comments)    Feels like "bugs are crawling all over me." Emotional and crying, stayed in bed.    Other Rash     Plastic tape Prefers paper tape    Tramadol Other (See Comments)    "feels like bugs crawling on me"    Medications: Current Outpatient Medications  Medication Sig Dispense Refill   ACCU-CHEK GUIDE test strip      cyanocobalamin 1000 MCG tablet Take 1,000 mcg by mouth daily.     ferrous sulfate 325 (65 FE) MG tablet Take 325 mg by mouth daily with breakfast.     folic acid (FOLVITE) 1 MG tablet Take 1 mg by mouth daily.     Garlic 0962 MG CAPS Take 1,000 mg by mouth daily.     levothyroxine (SYNTHROID) 100 MCG tablet Take 100 mcg by mouth daily before breakfast.     lisinopril (PRINIVIL,ZESTRIL) 2.5 MG tablet Take 2.5 mg by mouth daily.     metFORMIN (GLUCOPHAGE) 1000 MG tablet Take 1,000 mg by mouth 2 (two) times daily with a meal.     Omega-3 Fatty Acids (FISH OIL) 1000 MG CAPS Take by mouth. One tid     pantoprazole (PROTONIX) 40 MG tablet Take 40 mg by mouth 2 (two) times daily.     PARoxetine (PAXIL) 10 MG tablet Take 10 mg by mouth daily.     pregabalin (LYRICA) 300 MG capsule Take 300 mg by mouth 2 (two) times daily.     propranolol (INDERAL) 40 MG tablet Take 40 mg by mouth 2 (two) times daily.     rosuvastatin (CRESTOR) 5 MG tablet Take 5 mg by mouth every evening.     spironolactone (ALDACTONE) 25 MG tablet Take 25 mg by mouth 2 (two) times daily.     topiramate (TOPAMAX) 50 MG tablet Take 50 mg by mouth 2 (two) times daily.     No current facility-administered medications for this visit.    Review of Systems: GENERAL: negative for malaise, night sweats HEENT: No changes in hearing or vision, no nose bleeds or other nasal problems. NECK: Negative for lumps, goiter, pain and significant neck swelling RESPIRATORY: Negative for cough, wheezing CARDIOVASCULAR: Negative for chest pain, leg swelling, palpitations, orthopnea GI: SEE HPI MUSCULOSKELETAL: Negative for joint pain or swelling, back pain, and muscle pain. SKIN: Negative for lesions, rash PSYCH: Negative for sleep  disturbance, mood disorder and recent psychosocial stressors. HEMATOLOGY Negative for prolonged bleeding, bruising easily, and swollen nodes. ENDOCRINE: Negative for cold or heat intolerance, polyuria, polydipsia and goiter. NEURO: negative for tremor, gait imbalance, syncope and seizures. The remainder of the review of systems is noncontributory.   Physical Exam: BP 124/77 (BP Location: Left Arm, Patient Position: Sitting, Cuff Size: Large)   Pulse 71   Temp (!) 97.3 F (36.3 C) (Oral)   Ht 5' 7"  (1.702 m)   Wt 227 lb 11.2 oz (103.3 kg)   BMI 35.66 kg/m  GENERAL: The patient is AO x3, in no acute distress. HEENT: Head is normocephalic and atraumatic. EOMI are intact. Mouth is well hydrated and without lesions. NECK: Supple. No masses  LUNGS: Clear to auscultation. No presence of rhonchi/wheezing/rales. Adequate chest expansion HEART: RRR, normal s1 and s2. ABDOMEN: mildly tender in the R flank and epigastric area, no guarding, no peritoneal signs, and nondistended. BS +. No masses. EXTREMITIES: Without any cyanosis, clubbing, rash, lesions or edema. NEUROLOGIC: AOx3, no focal motor deficit. SKIN: no jaundice, no rashes  Imaging/Labs: as above  I personally reviewed and interpreted the available labs, imaging and endoscopic files.  Impression and Plan: VALYN LATCHFORD is a 56 y.o. female with past medical history of [name] cirrhosis complicated by nonbleeding esophageal varices, arthritis, hyperlipidemia, diabetes, hypertension, hypothyroidism, OSA and iron deficiency anemia, who presents for follow up of rectal bleeding.  Patient had an acute episode of rectal bleeding along with some right-sided abdominal pain which were not present before.  She underwent evaluation at the ER which did not show any abnormalities in the CT of the abdomen and pelvis with IV contrast and her hemoglobin was stable.  I consider her episode of lower gastrointestinal bleeding is likely related to  hemorrhoidal bleeding, less likely due to AVMs as she had a recent colonoscopy only showed these abnormalities.  She did not have any inflammatory changes that would be consistent with ischemic colitis.  We had a thorough discussion regarding the possibility of doing expectant management as her bleeding has slowed down versus repeating a colonoscopy.  Decision was made to monitor her symptoms for now, especially as she had a colonoscopy less than 6 months ago.  She will let us know if her bleeding worsens/recurs or if she has worsening of her abdominal pain, at which point we will proceed with a repeat colonoscopy.  We will prescribe Bentyl as needed for pain now.  We will also check a repeat CBC on Monday.  Overall, her liver cirrhosis has been compensated so far and she only presented 1 episode of varices requiring banding 7 years ago.  Her MELD score has remained low and has not presented any other decompensating events.  She should continue taking her current diuretic regimen.  Regarding her chronic diarrhea, this is possibly functional as her work-up has been negative so far for organic etiologies and we can consider adding an agent such as colestipol in the future, but will need to make sure her bleeding subsides first as having her stool may make her hemorrhoidal bleeding worse.  - CBC on Monday - If worsening/recurring rectal bleeding,patient should call the office to schedule colonoscopy. If lightheadedness, dizziness, vomiting blood/black contents, should go to the ER - Start Bentyl 1 tablet q12h as needed for abdominal pain - Reduce salt intake to <2 g per day - Can take Tylenol max of 2 g per day (650 mg q8h) for pain - Avoid NSAIDs for pain - Avoid eating raw oysters/shellfish - Protein shake (Ensure or Boost) every night before going to sleep - RTC December 2023  All questions were answered.      Harvel Quale, MD Gastroenterology and Hepatology [facility]

## 2022-04-05 NOTE — Patient Instructions (Addendum)
Perform blood workup on Monday If worsening/recurring rectal bleeding, call the office to schedule colonoscopy. If lightheadedness, dizziness, vomiting blood/black contents, go to the ER Reduce salt intake to <2 g per day Can take Tylenol max of 2 g per day (650 mg q8h) for pain Avoid NSAIDs for pain Avoid eating raw oysters/shellfish Protein shake (Ensure or Boost) every night before going to sleep

## 2022-04-13 LAB — CBC WITH DIFFERENTIAL/PLATELET
Absolute Monocytes: 239 cells/uL (ref 200–950)
Basophils Absolute: 9 cells/uL (ref 0–200)
Basophils Relative: 0.3 %
Eosinophils Absolute: 140 cells/uL (ref 15–500)
Eosinophils Relative: 4.5 %
HCT: 32.5 % — ABNORMAL LOW (ref 35.0–45.0)
Hemoglobin: 10.6 g/dL — ABNORMAL LOW (ref 11.7–15.5)
Lymphs Abs: 707 cells/uL — ABNORMAL LOW (ref 850–3900)
MCH: 30.3 pg (ref 27.0–33.0)
MCHC: 32.6 g/dL (ref 32.0–36.0)
MCV: 92.9 fL (ref 80.0–100.0)
MPV: 13.3 fL — ABNORMAL HIGH (ref 7.5–12.5)
Monocytes Relative: 7.7 %
Neutro Abs: 2006 cells/uL (ref 1500–7800)
Neutrophils Relative %: 64.7 %
Platelets: 55 10*3/uL — ABNORMAL LOW (ref 140–400)
RBC: 3.5 10*6/uL — ABNORMAL LOW (ref 3.80–5.10)
RDW: 13 % (ref 11.0–15.0)
Total Lymphocyte: 22.8 %
WBC: 3.1 10*3/uL — ABNORMAL LOW (ref 3.8–10.8)

## 2022-05-11 ENCOUNTER — Ambulatory Visit (INDEPENDENT_AMBULATORY_CARE_PROVIDER_SITE_OTHER): Payer: Medicare HMO | Admitting: Gastroenterology

## 2022-08-18 ENCOUNTER — Encounter (INDEPENDENT_AMBULATORY_CARE_PROVIDER_SITE_OTHER): Payer: Self-pay | Admitting: *Deleted

## 2022-09-04 ENCOUNTER — Telehealth (INDEPENDENT_AMBULATORY_CARE_PROVIDER_SITE_OTHER): Payer: Self-pay | Admitting: *Deleted

## 2022-09-04 DIAGNOSIS — K7460 Unspecified cirrhosis of liver: Secondary | ICD-10-CM

## 2022-09-04 NOTE — Telephone Encounter (Signed)
Received VM from pt. She received recall letter for [place] due in December.   Called pt back and provided [place] appt details. She voiced understanding

## 2022-09-12 ENCOUNTER — Ambulatory Visit (HOSPITAL_COMMUNITY)
Admission: RE | Admit: 2022-09-12 | Discharge: 2022-09-12 | Disposition: A | Discharge status: Home / Self Care | Payer: Medicare HMO | Source: Ambulatory Visit | Attending: Gastroenterology | Admitting: Gastroenterology

## 2022-09-12 DIAGNOSIS — K7581 Nonalcoholic steatohepatitis (NASH): Secondary | ICD-10-CM | POA: Insufficient documentation

## 2022-09-12 DIAGNOSIS — K7460 Unspecified cirrhosis of liver: Secondary | ICD-10-CM | POA: Insufficient documentation

## 2022-11-20 ENCOUNTER — Encounter (INDEPENDENT_AMBULATORY_CARE_PROVIDER_SITE_OTHER): Payer: Self-pay | Admitting: *Deleted

## 2022-12-19 ENCOUNTER — Telehealth (INDEPENDENT_AMBULATORY_CARE_PROVIDER_SITE_OTHER): Payer: Self-pay | Admitting: Gastroenterology

## 2022-12-19 NOTE — Telephone Encounter (Signed)
Who is your primary care physician: Dr.Xaje Hasanaj  Reasons for the EGD: 1 year follow up  Are you diabetic? If yes, Type 1 or Type 2?    Yes Type 2  Do you have a prosthetic or mechanical heart valve? No  Do you have a pacemaker/defibrillator?   No  Have you had endocarditis/atrial fibrillation? No  Have you had joint replacement within the last 12 months?  No  Do you have any history of drugs or alchohol?  No  Do you use supplemental oxygen?  No  Have you had a stroke or heart attack within the last 6 months?No  Do you take weight loss medication? No  For female patients: have you had a hysterectomy?  Yes                                     are you post menopausal?                                                   do you still have your menstrual cycle?       Do you take any blood-thinning medications such as: (aspirin, warfarin, Plavix, Aggrenox)  No  If yes we need the name, milligram, dosage and who is prescribing doctor  Current Outpatient Medications on File Prior to Visit  Medication Sig Dispense Refill   ACCU-CHEK GUIDE test strip      cyanocobalamin 1000 MCG tablet Take 1,000 mcg by mouth daily.     dicyclomine (BENTYL) 10 MG capsule Take 1 capsule (10 mg total) by mouth every 12 (twelve) hours as needed (abdominal pain). 60 capsule 0   ferrous sulfate 325 (65 FE) MG tablet Take 325 mg by mouth daily with breakfast.     folic acid (FOLVITE) 1 MG tablet Take 1 mg by mouth daily.     Garlic 123XX123 MG CAPS Take 1,000 mg by mouth daily.     levothyroxine (SYNTHROID) 100 MCG tablet Take 100 mcg by mouth daily before breakfast.     lisinopril (PRINIVIL,ZESTRIL) 2.5 MG tablet Take 2.5 mg by mouth daily.     metFORMIN (GLUCOPHAGE) 1000 MG tablet Take 1,000 mg by mouth 2 (two) times daily with a meal.     Omega-3 Fatty Acids (FISH OIL) 1000 MG CAPS Take by mouth. One tid     pantoprazole (PROTONIX) 40 MG tablet Take 40 mg by mouth 2 (two) times daily.     PARoxetine (PAXIL)  10 MG tablet Take 10 mg by mouth daily.     pregabalin (LYRICA) 300 MG capsule Take 300 mg by mouth 2 (two) times daily.     propranolol (INDERAL) 40 MG tablet Take 40 mg by mouth 2 (two) times daily.     rosuvastatin (CRESTOR) 5 MG tablet Take 5 mg by mouth every evening.     spironolactone (ALDACTONE) 25 MG tablet Take 25 mg by mouth 2 (two) times daily.     topiramate (TOPAMAX) 50 MG tablet Take 50 mg by mouth 2 (two) times daily.     No current facility-administered medications on file prior to visit.    Allergies  Allergen Reactions   Prednisone Other (See Comments)    Mental Status Changes (intolerance), Patient cannot recall this reaction, but recalls  feet hurting alfterwards     Tizanidine Other (See Comments)    Mental Status Changes (intolerance),Cannot recall this reaction     Chocolate Flavor Other (See Comments)     Migraine headache    Lorazepam Other (See Comments)    Feels like "bugs are crawling all over me." Emotional and crying, stayed in bed.    Other Rash    Plastic tape Prefers paper tape    Tramadol Other (See Comments)    "feels like bugs crawling on me"      Primary Insurance Name: Medicare/[employer]  Best number where you can be reached: [phone]

## 2022-12-19 NOTE — Telephone Encounter (Signed)
Room 3  Thanks 

## 2022-12-20 NOTE — Telephone Encounter (Signed)
Left message to return call 

## 2022-12-20 NOTE — Telephone Encounter (Signed)
Pt returned call and EGD scheduled. Instructions printed out and pt will come by office to pick them up. Will call pt with pre op appt.

## 2022-12-21 NOTE — Telephone Encounter (Signed)
Pre op appt: 01/05/23 @ 2:15 pm Priscilla Roberts. Pt contacted and made aware. Updated instructions sent to pt in regards to holding iron and metformin dose.

## 2023-01-03 NOTE — Patient Instructions (Signed)
Priscilla Roberts  01/03/2023     @PREFPERIOPPHARMACY @   Your procedure is scheduled on  01/09/2023.   Report to [facility] at  0600  A.M.   Call this number if you have problems the morning of surgery:  [phone]  If you experience any cold or flu symptoms such as cough, fever, chills, shortness of breath, etc. between now and your scheduled surgery, please notify us at the above number.   Remember:  Follow the diet instructions given to you by the office.    Your last dose of Ozempic should have been on 01/01/2023.      DOM NOT take any medications for diabetes the morning of your procedure.     Take these medicines the morning of surgery with A SIP OF WATER        levothyroxine, pantoprazole, paxil, lyrica, propranolol, topamax.     Do not wear jewelry, make-up or nail polish.  Do not wear lotions, powders, or perfumes, or deodorant.  Do not shave 48 hours prior to surgery.  Men may shave face and neck.  Do not bring valuables to the hospital.  [facility] is not responsible for any belongings or valuables.  Contacts, dentures or bridgework may not be worn into surgery.  Leave your suitcase in the car.  After surgery it may be brought to your room.  For patients admitted to the hospital, discharge time will be determined by your treatment team.  Patients discharged the day of surgery will not be allowed to drive home and must have someone with them for 24 hours.    Special instructions:   DO NOT smoke tobacco or vape for 24 hours before your procedure.  Please read over the following fact sheets that you were given. Anesthesia Post-op Instructions and Care and Recovery After Surgery      Upper Endoscopy, Adult, Care After After the procedure, it is common to have a sore throat. It is also common to have: Mild stomach pain or discomfort. Bloating. Nausea. Follow these instructions at home: The instructions below may help you care for yourself at home.  Your health care provider may give you more instructions. If you have questions, ask your health care provider. If you were given a sedative during the procedure, it can affect you for several hours. Do not drive or operate machinery until your health care provider says that it is safe. If you will be going home right after the procedure, plan to have a responsible adult: Take you home from the hospital or clinic. You will not be allowed to drive. Care for you for the time you are told. Follow instructions from your health care provider about what you may eat and drink. Return to your normal activities as told by your health care provider. Ask your health care provider what activities are safe for you. Take over-the-counter and prescription medicines only as told by your health care provider. Contact a health care provider if you: Have a sore throat that lasts longer than one day. Have trouble swallowing. Have a fever. Get help right away if you: Vomit blood or your vomit looks like coffee grounds. Have bloody, black, or tarry stools. Have a very bad sore throat or you cannot swallow. Have difficulty breathing or very bad pain in your chest or abdomen. These symptoms may be an emergency. Get help right away. Call 911. Do not wait to see if the symptoms will go away. Do not  drive yourself to the hospital. Summary After the procedure, it is common to have a sore throat, mild stomach discomfort, bloating, and nausea. If you were given a sedative during the procedure, it can affect you for several hours. Do not drive until your health care provider says that it is safe. Follow instructions from your health care provider about what you may eat and drink. Return to your normal activities as told by your health care provider. This information is not intended to replace advice given to you by your health care provider. Make sure you discuss any questions you have with your health care  provider. Document Revised: 12/28/2021 Document Reviewed: 12/28/2021 Elsevier Patient Education  [place] After The following information offers guidance on how to care for yourself after your procedure. Your health care provider may also give you more specific instructions. If you have problems or questions, contact your health care provider. What can I expect after the procedure? After the procedure, it is common to have: Tiredness. Little or no memory about what happened during or after the procedure. Impaired judgment when it comes to making decisions. Nausea or vomiting. Some trouble with balance. Follow these instructions at home: For the time period you were told by your health care provider:  Rest. Do not participate in activities where you could fall or become injured. Do not drive or use machinery. Do not drink alcohol. Do not take sleeping pills or medicines that cause drowsiness. Do not make important decisions or sign legal documents. Do not take care of children on your own. Medicines Take over-the-counter and prescription medicines only as told by your health care provider. If you were prescribed antibiotics, take them as told by your health care provider. Do not stop using the antibiotic even if you start to feel better. Eating and drinking Follow instructions from your health care provider about what you may eat and drink. Drink enough fluid to keep your urine pale yellow. If you vomit: Drink clear fluids slowly and in small amounts as you are able. Clear fluids include water, ice chips, low-calorie sports drinks, and fruit juice that has water added to it (diluted fruit juice). Eat light and bland foods in small amounts as you are able. These foods include bananas, applesauce, rice, lean meats, toast, and crackers. General instructions  Have a responsible adult stay with you for the time you are told. It is important to  have someone help care for you until you are awake and alert. If you have sleep apnea, surgery and some medicines can increase your risk for breathing problems. Follow instructions from your health care provider about wearing your sleep device: When you are sleeping. This includes during daytime naps. While taking prescription pain medicines, sleeping medicines, or medicines that make you drowsy. Do not use any products that contain nicotine or tobacco. These products include cigarettes, chewing tobacco, and vaping devices, such as e-cigarettes. If you need help quitting, ask your health care provider. Contact a health care provider if: You feel nauseous or vomit every time you eat or drink. You feel light-headed. You are still sleepy or having trouble with balance after 24 hours. You get a rash. You have a fever. You have redness or swelling around the IV site. Get help right away if: You have trouble breathing. You have new confusion after you get home. These symptoms may be an emergency. Get help right away. Call 911. Do not wait to see if the  symptoms will go away. Do not drive yourself to the hospital. This information is not intended to replace advice given to you by your health care provider. Make sure you discuss any questions you have with your health care provider. Document Revised: 02/13/2022 Document Reviewed: 02/13/2022 Elsevier Patient Education  [place].

## 2023-01-05 ENCOUNTER — Encounter (HOSPITAL_COMMUNITY): Payer: Self-pay

## 2023-01-05 ENCOUNTER — Encounter (HOSPITAL_COMMUNITY)
Admission: RE | Admit: 2023-01-05 | Discharge: 2023-01-05 | Disposition: A | Discharge status: Home / Self Care | Payer: Medicare HMO | Source: Ambulatory Visit | Attending: Gastroenterology | Admitting: Gastroenterology

## 2023-01-05 VITALS — BP 119/69 | HR 73 | Temp 97.6°F | Resp 18 | Ht 67.0 in | Wt 227.7 lb

## 2023-01-05 DIAGNOSIS — K7581 Nonalcoholic steatohepatitis (NASH): Secondary | ICD-10-CM | POA: Diagnosis not present

## 2023-01-05 DIAGNOSIS — I10 Essential (primary) hypertension: Secondary | ICD-10-CM | POA: Diagnosis not present

## 2023-01-05 DIAGNOSIS — K7460 Unspecified cirrhosis of liver: Secondary | ICD-10-CM | POA: Insufficient documentation

## 2023-01-05 HISTORY — DX: Anxiety disorder, unspecified: F41.9

## 2023-01-05 HISTORY — DX: Hypothyroidism, unspecified: E03.9

## 2023-01-05 LAB — PROTIME-INR
INR: 1.2 (ref 0.8–1.2)
Prothrombin Time: 14.6 seconds (ref 11.4–15.2)

## 2023-01-09 ENCOUNTER — Encounter (HOSPITAL_COMMUNITY)
Admission: RE | Disposition: A | Discharge status: Home / Self Care | Payer: Self-pay | Source: Home / Self Care | Attending: Gastroenterology

## 2023-01-09 ENCOUNTER — Ambulatory Visit (HOSPITAL_COMMUNITY)
Admission: RE | Admit: 2023-01-09 | Discharge: 2023-01-09 | Disposition: A | Discharge status: Home / Self Care | Payer: Medicare HMO | Attending: Gastroenterology | Admitting: Gastroenterology

## 2023-01-09 ENCOUNTER — Ambulatory Visit (HOSPITAL_COMMUNITY): Payer: Medicare HMO | Admitting: Anesthesiology

## 2023-01-09 ENCOUNTER — Encounter (HOSPITAL_COMMUNITY): Payer: Self-pay | Admitting: Gastroenterology

## 2023-01-09 ENCOUNTER — Ambulatory Visit (HOSPITAL_BASED_OUTPATIENT_CLINIC_OR_DEPARTMENT_OTHER): Payer: Medicare HMO | Admitting: Anesthesiology

## 2023-01-09 DIAGNOSIS — I8500 Esophageal varices without bleeding: Secondary | ICD-10-CM

## 2023-01-09 DIAGNOSIS — D131 Benign neoplasm of stomach: Secondary | ICD-10-CM | POA: Diagnosis not present

## 2023-01-09 DIAGNOSIS — Z7984 Long term (current) use of oral hypoglycemic drugs: Secondary | ICD-10-CM | POA: Diagnosis not present

## 2023-01-09 DIAGNOSIS — I10 Essential (primary) hypertension: Secondary | ICD-10-CM

## 2023-01-09 DIAGNOSIS — D509 Iron deficiency anemia, unspecified: Secondary | ICD-10-CM | POA: Insufficient documentation

## 2023-01-09 DIAGNOSIS — I8510 Secondary esophageal varices without bleeding: Secondary | ICD-10-CM | POA: Insufficient documentation

## 2023-01-09 DIAGNOSIS — Z87891 Personal history of nicotine dependence: Secondary | ICD-10-CM | POA: Insufficient documentation

## 2023-01-09 DIAGNOSIS — Z09 Encounter for follow-up examination after completed treatment for conditions other than malignant neoplasm: Secondary | ICD-10-CM | POA: Diagnosis not present

## 2023-01-09 DIAGNOSIS — M1990 Unspecified osteoarthritis, unspecified site: Secondary | ICD-10-CM | POA: Insufficient documentation

## 2023-01-09 DIAGNOSIS — K635 Polyp of colon: Secondary | ICD-10-CM | POA: Diagnosis not present

## 2023-01-09 DIAGNOSIS — K317 Polyp of stomach and duodenum: Secondary | ICD-10-CM | POA: Diagnosis not present

## 2023-01-09 DIAGNOSIS — K7460 Unspecified cirrhosis of liver: Secondary | ICD-10-CM | POA: Insufficient documentation

## 2023-01-09 DIAGNOSIS — E7800 Pure hypercholesterolemia, unspecified: Secondary | ICD-10-CM | POA: Insufficient documentation

## 2023-01-09 DIAGNOSIS — K7581 Nonalcoholic steatohepatitis (NASH): Secondary | ICD-10-CM | POA: Insufficient documentation

## 2023-01-09 DIAGNOSIS — E039 Hypothyroidism, unspecified: Secondary | ICD-10-CM | POA: Insufficient documentation

## 2023-01-09 DIAGNOSIS — G4730 Sleep apnea, unspecified: Secondary | ICD-10-CM | POA: Diagnosis not present

## 2023-01-09 DIAGNOSIS — G4733 Obstructive sleep apnea (adult) (pediatric): Secondary | ICD-10-CM | POA: Diagnosis not present

## 2023-01-09 DIAGNOSIS — E119 Type 2 diabetes mellitus without complications: Secondary | ICD-10-CM | POA: Insufficient documentation

## 2023-01-09 HISTORY — PX: HEMOSTASIS CLIP PLACEMENT: SHX6857

## 2023-01-09 HISTORY — PX: ESOPHAGOGASTRODUODENOSCOPY (EGD) WITH PROPOFOL: SHX5813

## 2023-01-09 HISTORY — PX: POLYPECTOMY: SHX5525

## 2023-01-09 LAB — GLUCOSE, CAPILLARY: Glucose-Capillary: 92 mg/dL (ref 70–99)

## 2023-01-09 SURGERY — ESOPHAGOGASTRODUODENOSCOPY (EGD) WITH PROPOFOL
Anesthesia: General

## 2023-01-09 MED ORDER — LACTATED RINGERS IV SOLN: INTRAVENOUS | Status: DC | PRN

## 2023-01-09 MED ORDER — LIDOCAINE HCL 1 % IJ SOLN
INTRAMUSCULAR | Status: DC | PRN
  Administered 2023-01-09: 50 mg via INTRADERMAL

## 2023-01-09 MED ORDER — PROPOFOL 10 MG/ML IV BOLUS
INTRAVENOUS | Status: DC | PRN
  Administered 2023-01-09 (×3): 20 mg via INTRAVENOUS
  Administered 2023-01-09: 80 mg via INTRAVENOUS

## 2023-01-09 NOTE — H&P (Signed)
Priscilla Roberts is an 58 y.o. female.   Chief Complaint: History of gastric polyps and screening for esophageal varices HPI: Priscilla Roberts is a 57 y.o. 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  The patient denies having any nausea, vomiting, fever, chills, hematochezia, melena, hematemesis, abdominal distention, abdominal pain, diarrhea, jaundice, pruritus or weight loss.  Last EGD 12/27/21 - Mucosal nodule found in the esophagus. Biopsied. - Grade I esophageal varices. - Multiple gastric polyps. Resected and retrieved x2. Clips were placed. - Normal examined duodenum.   Past Medical History:  Diagnosis Date   Anxiety    Arthritis    Cancer    ovarian cancer   Diabetes mellitus without complication    High cholesterol    Hypertension    Hypothyroidism    Liver cirrhosis secondary to NASH    Sleep apnea    Thyroid disease     Past Surgical History:  Procedure Laterality Date   ABDOMINAL HYSTERECTOMY     BIOPSY  12/27/2021   Procedure: BIOPSY;  Surgeon: Dolores Frame, MD;  Location: [facility];  Service: Gastroenterology;;   COLONOSCOPY WITH PROPOFOL N/A 10/26/2021   Procedure: COLONOSCOPY WITH PROPOFOL;  Surgeon: Dolores Frame, MD;  Location: [facility];  Service: Gastroenterology;  Laterality: N/A;  10:50   ESOPHAGOGASTRODUODENOSCOPY (EGD) WITH PROPOFOL N/A 10/26/2021   Procedure: ESOPHAGOGASTRODUODENOSCOPY (EGD) WITH PROPOFOL;  Surgeon: Dolores Frame, MD;  Location: [facility];  Service: Gastroenterology;  Laterality: N/A;   ESOPHAGOGASTRODUODENOSCOPY (EGD) WITH PROPOFOL N/A 12/27/2021   Procedure: ESOPHAGOGASTRODUODENOSCOPY (EGD) WITH PROPOFOL;  Surgeon: Dolores Frame, MD;  [facility];  Service: Gastroenterology;  Laterality: N/A;  7:30 / Per Dr Levon Hedger needs a 1 hour time slot / follow up (Recall)   HEMOSTASIS CLIP PLACEMENT  10/26/2021   Procedure: HEMOSTASIS CLIP PLACEMENT;  Surgeon: Dolores Frame, MD;  Location: [facility];  Service: Gastroenterology;;   HEMOSTASIS CLIP PLACEMENT  12/27/2021   Procedure: HEMOSTASIS CLIP PLACEMENT;  Surgeon: Dolores Frame, MD;  [facility]: [facility];  Service: Gastroenterology;;   JOINT REPLACEMENT Bilateral    bilateral thumb joint replacement   POLYPECTOMY  10/26/2021   Procedure: POLYPECTOMY;  Surgeon: Dolores Frame, MD;  Location: [facility];  Service: Gastroenterology;;  gastric   POLYPECTOMY  12/27/2021   Procedure: POLYPECTOMY INTESTINAL;  Surgeon: Dolores Frame, MD;  Location: [facility];  Service: Gastroenterology;;   THUMB ARTHROSCOPY      Family History  Problem Relation Age of Onset   Hypertension Mother    Thyroid disease Mother    Diabetes Mother    Heart failure Mother    Social History:  reports that she has quit smoking. She has been exposed to tobacco smoke. She has never used smokeless tobacco. She reports that she does not drink alcohol and does not use drugs.  Allergies:  Allergies  Allergen Reactions   Prednisone Other (See Comments)    Mental Status Changes (intolerance), Patient cannot recall this reaction, but recalls feet hurting alfterwards     Tizanidine Other (See Comments)    Mental Status Changes (intolerance),Cannot recall this reaction     Chocolate Flavor Other (See Comments)     Migraine headache    Lorazepam Other (See Comments)    Feels like "bugs are  crawling all over me." Emotional and crying, stayed in bed.    Other Rash    Plastic tape Prefers paper tape    Tramadol Other (See Comments)    "feels like bugs crawling on me"    Medications Prior to Admission  Medication Sig Dispense Refill   ACCU-CHEK GUIDE  test strip      Cholecalciferol (VITAMIN D3 PO) Take 1 tablet by mouth daily.     cyanocobalamin 1000 MCG tablet Take 1,000 mcg by mouth daily.     folic acid (FOLVITE) 1 MG tablet Take 1 mg by mouth daily.     Garlic 1000 MG CAPS Take 1,000 mg by mouth daily.     levothyroxine (SYNTHROID) 100 MCG tablet Take 100 mcg by mouth daily before breakfast.     lisinopril (PRINIVIL,ZESTRIL) 2.5 MG tablet Take 2.5 mg by mouth daily.     metFORMIN (GLUCOPHAGE) 1000 MG tablet Take 1,000 mg by mouth 2 (two) times daily with a meal.     Omega-3 Fatty Acids (FISH OIL) 1000 MG CAPS Take 1,000 mg by mouth in the morning, at noon, and at bedtime.     OZEMPIC, 1 MG/DOSE, 4 MG/3ML SOPN Inject 1 mg into the skin every 7 (seven) days.     pantoprazole (PROTONIX) 40 MG tablet Take 40 mg by mouth 2 (two) times daily.     PARoxetine (PAXIL) 10 MG tablet Take 10 mg by mouth daily.     pregabalin (LYRICA) 300 MG capsule Take 300 mg by mouth 2 (two) times daily.     propranolol (INDERAL) 40 MG tablet Take 40 mg by mouth 2 (two) times daily.     rosuvastatin (CRESTOR) 5 MG tablet Take 5 mg by mouth every evening.     spironolactone (ALDACTONE) 25 MG tablet Take 25 mg by mouth 2 (two) times daily.     topiramate (TOPAMAX) 50 MG tablet Take 50 mg by mouth 2 (two) times daily.     dicyclomine (BENTYL) 10 MG capsule Take 1 capsule (10 mg total) by mouth every 12 (twelve) hours as needed (abdominal pain). (Patient not taking: Reported on 01/03/2023) 60 capsule 0   ferrous sulfate 325 (65 FE) MG tablet Take 325 mg by mouth at bedtime.      Results for orders placed or performed during the hospital encounter of 01/09/23 (from the past 48 hour(s))  Glucose, capillary     Status: None   Collection Time: 01/09/23  7:08 AM  Result Value Ref Range   Glucose-Capillary 92 70 - 99 mg/dL    Comment: Glucose reference range applies only to samples taken after fasting for at least 8 hours.   No results found.  Review of Systems  All  other systems reviewed and are negative.   Blood pressure 109/70, pulse 71, temperature 97.7 F (36.5 C), resp. rate 20, SpO2 96 %. Physical Exam  GENERAL: The patient is AO x3, in no acute distress. HEENT: Head is normocephalic and atraumatic. EOMI are intact. Mouth is well hydrated and without lesions. NECK: Supple. No masses LUNGS: Clear to auscultation. No presence of rhonchi/wheezing/rales. Adequate chest expansion HEART: RRR, normal s1 and s2. ABDOMEN: Soft, nontender, no guarding, no peritoneal signs, and nondistended. BS +. No masses. EXTREMITIES: Without any cyanosis, clubbing, rash, lesions or edema. NEUROLOGIC: AOx3, no focal motor deficit. SKIN: no jaundice, no rashes Assessment/Plan Priscilla Roberts is a 57 y.o. 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 Will proceed with EGD.  Dolores Frameaniel Castaneda Mayorga, MD 01/09/2023, 7:22 AM

## 2023-01-09 NOTE — Discharge Instructions (Signed)
You are being discharged to home.  Resume your previous diet.  We are waiting for your pathology results.  Your physician has recommended a repeat upper endoscopy in two years for surveillance.

## 2023-01-09 NOTE — Anesthesia Preprocedure Evaluation (Signed)
Anesthesia Evaluation  Patient identified by MRN, date of birth, ID band Patient awake    Reviewed: Allergy & Precautions, H&P , NPO status , Patient's Chart, lab work & pertinent test results, reviewed documented beta blocker date and time   Airway Mallampati: II  TM Distance: >3 FB Neck ROM: full    Dental no notable dental hx.    Pulmonary sleep apnea , former smoker   Pulmonary exam normal breath sounds clear to auscultation       Cardiovascular Exercise Tolerance: Good hypertension, negative cardio ROS  Rhythm:regular Rate:Normal     Neuro/Psych negative neurological ROS  negative psych ROS   GI/Hepatic negative GI ROS,,,(+) Hepatitis -, Autoimmune  Endo/Other  diabetesHypothyroidism    Renal/GU negative Renal ROS  negative genitourinary   Musculoskeletal  (+) Arthritis ,    Abdominal   Peds  Hematology  (+) Blood dyscrasia, anemia   Anesthesia Other Findings   Reproductive/Obstetrics negative OB ROS                             Anesthesia Physical Anesthesia Plan  ASA: 3  Anesthesia Plan: General   Post-op Pain Management:    Induction:   PONV Risk Score and Plan: Propofol infusion  Airway Management Planned:   Additional Equipment:   Intra-op Plan:   Post-operative Plan:   Informed Consent: I have reviewed the patients History and Physical, chart, labs and discussed the procedure including the risks, benefits and alternatives for the proposed anesthesia with the patient or authorized representative who has indicated his/her understanding and acceptance.     Dental Advisory Given  Plan Discussed with: CRNA  Anesthesia Plan Comments:         Anesthesia Quick Evaluation

## 2023-01-09 NOTE — Anesthesia Postprocedure Evaluation (Signed)
Anesthesia Post Note  Patient: Priscilla Roberts  Procedure(s) Performed: ESOPHAGOGASTRODUODENOSCOPY (EGD) WITH PROPOFOL POLYPECTOMY HEMOSTASIS CLIP PLACEMENT  Patient location during evaluation: Short Stay Anesthesia Type: General Level of consciousness: awake and alert Pain management: pain level controlled Vital Signs Assessment: post-procedure vital signs reviewed and stable Respiratory status: spontaneous breathing Cardiovascular status: stable Postop Assessment: no apparent nausea or vomiting Anesthetic complications: no   No notable events documented.   Last Vitals:  Vitals:   01/09/23 0634  BP: 109/70  Pulse: 71  Resp: 20  Temp: 36.5 C  SpO2: 96%    Last Pain:  Vitals:   01/09/23 0732  PainSc: 8                  Jaimin Krupka

## 2023-01-09 NOTE — Transfer of Care (Signed)
Immediate Anesthesia Transfer of Care Note  Patient: Priscilla Roberts  Procedure(s) Performed: ESOPHAGOGASTRODUODENOSCOPY (EGD) WITH PROPOFOL POLYPECTOMY HEMOSTASIS CLIP PLACEMENT  Patient Location: Short Stay  Anesthesia Type:General  Level of Consciousness: awake  Airway & Oxygen Therapy: Patient Spontanous Breathing  Post-op Assessment: Report given to RN  Post vital signs: Reviewed and stable  Last Vitals:  Vitals Value Taken Time  BP    Temp    Pulse    Resp    SpO2      Last Pain:  Vitals:   01/09/23 0732  PainSc: 8       Patients Stated Pain Goal: 7 (01/09/23 8280)  Complications: No notable events documented.

## 2023-01-09 NOTE — Op Note (Signed)
[facility] Patient Name: Priscilla Roberts Procedure Date: 01/09/2023 7:18 AM MRN: [id number] Date of Birth: 1966-08-02 Attending MD: Katrinka Blazing , , [id number] CSN: [id number] Age: 57 Admit Type: Outpatient Procedure:                Upper GI endoscopy Indications:              Follow-up of benign gastric tumor, Follow-up of                            esophageal varices Providers:                Katrinka Blazing, Sheran Fava, Burke Keels, Technician Referring MD:              Medicines:                Monitored Anesthesia Care Complications:            No immediate complications. Estimated Blood Loss:     Estimated blood loss: none. Procedure:                Pre-Anesthesia Assessment:                           - Prior to the procedure, a History and Physical                            was performed, and patient medications, allergies                            and sensitivities were reviewed. The patient's                            tolerance of previous anesthesia was reviewed.                           - The risks and benefits of the procedure and the                            sedation options and risks were discussed with the                            patient. All questions were answered and informed                            consent was obtained.                           - ASA Grade Assessment: III - A patient with severe                            systemic disease.                           After obtaining informed consent, the endoscope was  passed under direct vision. Throughout the                            procedure, the patient's blood pressure, pulse, and                            oxygen saturations were monitored continuously. The                            [id number]-[id number] ([id number] scope was introduced through the                            mouth, and advanced to the second part of duodenum.                             The upper GI endoscopy was accomplished without                            difficulty. The patient tolerated the procedure                            well. Scope In: 7:36:33 AM Scope Out: 7:49:42 AM Total Procedure Duration: 0 hours 13 minutes 9 seconds  Findings:      Grade I varices were found in the lower third of the esophagus.      A single 15 mm sessile polyp with no bleeding and no stigmata of recent       bleeding was found in the gastric body. The polyp was removed with a hot       snare. Resection and retrieval were complete. To prevent bleeding after       the polypectomy, three hemostatic clips were successfully placed. Clip       manufacturer: [employer]. There was no bleeding at the end of the       procedure.      The examined duodenum was normal. Impression:               - Grade I esophageal varices.                           - A single gastric polyp. Resected and retrieved.                            Clips were placed. Clip manufacturer: [employer]                            [employer].                           - Normal examined duodenum. Moderate Sedation:      Per Anesthesia Care Recommendation:           - Discharge patient to home (ambulatory).                           - Resume previous diet.                           -  Await pathology results.                           - Repeat upper endoscopy in 2 years for                            surveillance.                           - Continue present medications. Procedure Code(s):        --- Professional ---                           [phone], Esophagogastroduodenoscopy, flexible,                            transoral; with removal of tumor(s), polyp(s), or                            other lesion(s) by snare technique Diagnosis Code(s):        --- Professional ---                           I85.00, Esophageal varices without bleeding                           K31.7, Polyp of stomach and duodenum                            D13.1, Benign neoplasm of stomach CPT copyright 2022 American Medical Association. All rights reserved. The codes documented in this report are preliminary and upon coder review may  be revised to meet current compliance requirements. Katrinka Blazing, MD Katrinka Blazing,  01/09/2023 7:58:28 AM This report has been signed electronically. Number of Addenda: 0

## 2023-01-11 LAB — SURGICAL PATHOLOGY

## 2023-01-16 ENCOUNTER — Encounter (HOSPITAL_COMMUNITY): Payer: Self-pay | Admitting: Gastroenterology

## 2023-01-31 DIAGNOSIS — E038 Other specified hypothyroidism: Secondary | ICD-10-CM | POA: Diagnosis not present

## 2023-01-31 DIAGNOSIS — I10 Essential (primary) hypertension: Secondary | ICD-10-CM | POA: Diagnosis not present

## 2023-01-31 DIAGNOSIS — E1143 Type 2 diabetes mellitus with diabetic autonomic (poly)neuropathy: Secondary | ICD-10-CM | POA: Diagnosis not present

## 2023-01-31 DIAGNOSIS — M1732 Unilateral post-traumatic osteoarthritis, left knee: Secondary | ICD-10-CM | POA: Diagnosis not present

## 2023-01-31 DIAGNOSIS — K219 Gastro-esophageal reflux disease without esophagitis: Secondary | ICD-10-CM | POA: Diagnosis not present

## 2023-01-31 DIAGNOSIS — K7469 Other cirrhosis of liver: Secondary | ICD-10-CM | POA: Diagnosis not present

## 2023-01-31 DIAGNOSIS — N1831 Chronic kidney disease, stage 3a: Secondary | ICD-10-CM | POA: Diagnosis not present

## 2023-01-31 DIAGNOSIS — F331 Major depressive disorder, recurrent, moderate: Secondary | ICD-10-CM | POA: Diagnosis not present

## 2023-01-31 DIAGNOSIS — E669 Obesity, unspecified: Secondary | ICD-10-CM | POA: Diagnosis not present

## 2023-02-08 ENCOUNTER — Encounter (INDEPENDENT_AMBULATORY_CARE_PROVIDER_SITE_OTHER): Payer: Self-pay | Admitting: *Deleted

## 2023-03-20 DIAGNOSIS — Z6835 Body mass index (BMI) 35.0-35.9, adult: Secondary | ICD-10-CM | POA: Diagnosis not present

## 2023-03-20 DIAGNOSIS — J029 Acute pharyngitis, unspecified: Secondary | ICD-10-CM | POA: Diagnosis not present

## 2023-03-20 DIAGNOSIS — E669 Obesity, unspecified: Secondary | ICD-10-CM | POA: Diagnosis not present

## 2023-04-09 DIAGNOSIS — Z961 Presence of intraocular lens: Secondary | ICD-10-CM | POA: Diagnosis not present

## 2023-04-09 DIAGNOSIS — E113293 Type 2 diabetes mellitus with mild nonproliferative diabetic retinopathy without macular edema, bilateral: Secondary | ICD-10-CM | POA: Diagnosis not present

## 2023-04-09 DIAGNOSIS — H52223 Regular astigmatism, bilateral: Secondary | ICD-10-CM | POA: Diagnosis not present

## 2023-04-09 DIAGNOSIS — Z7984 Long term (current) use of oral hypoglycemic drugs: Secondary | ICD-10-CM | POA: Diagnosis not present

## 2023-04-09 DIAGNOSIS — H524 Presbyopia: Secondary | ICD-10-CM | POA: Diagnosis not present

## 2023-04-21 ENCOUNTER — Encounter (HOSPITAL_COMMUNITY): Payer: Self-pay | Admitting: Emergency Medicine

## 2023-04-21 ENCOUNTER — Emergency Department (HOSPITAL_COMMUNITY)
Admission: EM | Admit: 2023-04-21 | Discharge: 2023-04-21 | Disposition: A | Discharge status: Home / Self Care | Payer: Medicare HMO | Attending: Student | Admitting: Student

## 2023-04-21 ENCOUNTER — Other Ambulatory Visit: Payer: Self-pay

## 2023-04-21 DIAGNOSIS — R21 Rash and other nonspecific skin eruption: Secondary | ICD-10-CM

## 2023-04-21 DIAGNOSIS — L509 Urticaria, unspecified: Secondary | ICD-10-CM | POA: Insufficient documentation

## 2023-04-21 MED ORDER — PREDNISONE 10 MG PO TABS: ORAL_TABLET | ORAL | 0 refills | Status: DC

## 2023-04-21 NOTE — ED Provider Notes (Signed)
[place] EMERGENCY DEPARTMENT AT [facility] Provider Note   CSN: [id number] Arrival date & time: 04/21/23  1557     History {Add pertinent medical, surgical, social history, OB history to HPI:1} Chief Complaint  Patient presents with   Rash    Priscilla Roberts is a 57 y.o. female.  The history is provided by the patient.  Rash      Home Medications Prior to Admission medications   Medication Sig Start Date End Date Taking? Authorizing Provider  ACCU-CHEK GUIDE test strip  09/30/21   [provider]  Cholecalciferol (VITAMIN D3 PO) Take 1 tablet by mouth daily.    [provider]  cyanocobalamin 1000 MCG tablet Take 1,000 mcg by mouth daily.    [provider]  dicyclomine (BENTYL) 10 MG capsule Take 1 capsule (10 mg total) by mouth every 12 (twelve) hours as needed (abdominal pain). Patient not taking: Reported on 01/03/2023 04/05/22   Dolores Frame, MD  ferrous sulfate 325 (65 FE) MG tablet Take 325 mg by mouth at bedtime. 01/08/20   [provider]  folic acid (FOLVITE) 1 MG tablet Take 1 mg by mouth daily.    [provider]  Garlic 1000 MG CAPS Take 1,000 mg by mouth daily.    [provider]  levothyroxine (SYNTHROID) 100 MCG tablet Take 100 mcg by mouth daily before breakfast.    [provider]  lisinopril (PRINIVIL,ZESTRIL) 2.5 MG tablet Take 2.5 mg by mouth daily.    [provider]  metFORMIN (GLUCOPHAGE) 1000 MG tablet Take 1,000 mg by mouth 2 (two) times daily with a meal.    [provider]  Omega-3 Fatty Acids (FISH OIL) 1000 MG CAPS Take 1,000 mg by mouth in the morning, at noon, and at bedtime.    [provider]  OZEMPIC, 1 MG/DOSE, 4 MG/3ML SOPN Inject 1 mg into the skin every 7 (seven) days. 12/27/22   [provider]  pantoprazole (PROTONIX) 40 MG tablet Take 40 mg by mouth 2 (two) times daily. 09/04/19   [provider]  PARoxetine  (PAXIL) 10 MG tablet Take 10 mg by mouth daily. 09/04/19   [provider]  pregabalin (LYRICA) 300 MG capsule Take 300 mg by mouth 2 (two) times daily. 03/16/20   [provider]  propranolol (INDERAL) 40 MG tablet Take 40 mg by mouth 2 (two) times daily. 12/02/18   [provider]  rosuvastatin (CRESTOR) 5 MG tablet Take 5 mg by mouth every evening. 09/30/19   [provider]  spironolactone (ALDACTONE) 25 MG tablet Take 25 mg by mouth 2 (two) times daily.    [provider]  topiramate (TOPAMAX) 50 MG tablet Take 50 mg by mouth 2 (two) times daily.    [provider]      Allergies    Prednisone, Tizanidine, Chocolate flavor, Lorazepam, Other, and Tramadol    Review of Systems   Review of Systems  Skin:  Positive for rash.    Physical Exam Updated Vital Signs BP 113/69 (BP Location: Right Arm)   Pulse 75   Temp 98 F (36.7 C)   Resp 18   Ht 5\' 7"  (1.702 m)   Wt 95.3 kg   SpO2 98%   BMI 32.89 kg/m  Physical Exam  ED Results / Procedures / Treatments   Labs (all labs ordered are listed, but only abnormal results are displayed) Labs Reviewed - No data to display  EKG None  Radiology No results found.  Procedures Procedures  {Document cardiac monitor, telemetry assessment procedure when appropriate:1}  Medications Ordered in ED Medications - No data to display  ED Course/ Medical Decision Making/ A&P   {   Click here for ABCD2, HEART and other calculatorsREFRESH Note before signing :1}                          Medical Decision Making  ***  {Document critical care time when appropriate:1} {Document review of labs and clinical decision tools ie heart score, Chads2Vasc2 etc:1}  {Document your independent review of radiology images, and any outside records:1} {Document your discussion with family members, caretakers, and with consultants:1} {Document social determinants of health affecting pt's  care:1} {Document your decision making why or why not admission, treatments were needed:1} Final Clinical Impression(s) / ED Diagnoses Final diagnoses:  None    Rx / DC Orders ED Discharge Orders     None

## 2023-04-21 NOTE — ED Triage Notes (Signed)
Pt reports rash that started on her arms and is now spreading "all over my body." Pt reports this has been going on for 1 month. Denies fevers.

## 2023-04-21 NOTE — Discharge Instructions (Signed)
Your rash appears to be an allergic histamine like reaction, I do recommend continuing your Benadryl before bedtime, you may add either Claritin or Zyrtec in place of Benadryl if desired as these are similar medicines but will not make you drowsy.  You are being placed on a short prednisone taper, make sure you are watching your blood glucose levels while on this medication and be in contact with your doctor if your glucose levels rise, I do not think they will rise significantly given this is a very quick taper.  You may also continue using topical steroids for the itch your locations.  I also recommend cool compresses, even ice packs can help with particularly itchy areas.  Goldbond anti-itch cream or the generic of this is an excellent medicine to help with itching as well.  Plan to follow-up with the dermatologist on the 31st, if this does not improve your symptoms they will have other advised to give.

## 2023-04-24 ENCOUNTER — Telehealth: Payer: Self-pay | Admitting: *Deleted

## 2023-04-24 NOTE — Telephone Encounter (Signed)
Transition Care Management Unsuccessful Follow-up Telephone Call  Date of discharge and from where:  [facility]  Attempts:  1st Attempt  Reason for unsuccessful TCM follow-up call:  Left voice message

## 2023-04-25 ENCOUNTER — Telehealth: Payer: Self-pay | Admitting: *Deleted

## 2023-04-25 NOTE — Telephone Encounter (Signed)
Transition Care Management Unsuccessful Follow-up Telephone Call  Date of discharge and from where:  [facility] 04/21/2023  Attempts:  2nd Attempt  Reason for unsuccessful TCM follow-up call:  No answer/busy

## 2023-04-26 DIAGNOSIS — Z6833 Body mass index (BMI) 33.0-33.9, adult: Secondary | ICD-10-CM | POA: Diagnosis not present

## 2023-04-26 DIAGNOSIS — I10 Essential (primary) hypertension: Secondary | ICD-10-CM | POA: Diagnosis not present

## 2023-04-26 DIAGNOSIS — L509 Urticaria, unspecified: Secondary | ICD-10-CM | POA: Diagnosis not present

## 2023-05-09 DIAGNOSIS — F331 Major depressive disorder, recurrent, moderate: Secondary | ICD-10-CM | POA: Diagnosis not present

## 2023-05-09 DIAGNOSIS — L509 Urticaria, unspecified: Secondary | ICD-10-CM | POA: Diagnosis not present

## 2023-05-09 DIAGNOSIS — D500 Iron deficiency anemia secondary to blood loss (chronic): Secondary | ICD-10-CM | POA: Diagnosis not present

## 2023-05-09 DIAGNOSIS — I10 Essential (primary) hypertension: Secondary | ICD-10-CM | POA: Diagnosis not present

## 2023-05-09 DIAGNOSIS — E038 Other specified hypothyroidism: Secondary | ICD-10-CM | POA: Diagnosis not present

## 2023-05-09 DIAGNOSIS — K219 Gastro-esophageal reflux disease without esophagitis: Secondary | ICD-10-CM | POA: Diagnosis not present

## 2023-05-09 DIAGNOSIS — E1143 Type 2 diabetes mellitus with diabetic autonomic (poly)neuropathy: Secondary | ICD-10-CM | POA: Diagnosis not present

## 2023-05-09 DIAGNOSIS — K7581 Nonalcoholic steatohepatitis (NASH): Secondary | ICD-10-CM | POA: Diagnosis not present

## 2023-05-09 DIAGNOSIS — E611 Iron deficiency: Secondary | ICD-10-CM | POA: Diagnosis not present

## 2023-05-09 DIAGNOSIS — D61818 Other pancytopenia: Secondary | ICD-10-CM | POA: Diagnosis not present

## 2023-05-09 DIAGNOSIS — M1732 Unilateral post-traumatic osteoarthritis, left knee: Secondary | ICD-10-CM | POA: Diagnosis not present

## 2023-05-09 DIAGNOSIS — K7469 Other cirrhosis of liver: Secondary | ICD-10-CM | POA: Diagnosis not present

## 2023-05-09 DIAGNOSIS — N1831 Chronic kidney disease, stage 3a: Secondary | ICD-10-CM | POA: Diagnosis not present

## 2023-05-09 DIAGNOSIS — K7460 Unspecified cirrhosis of liver: Secondary | ICD-10-CM | POA: Diagnosis not present

## 2023-05-11 ENCOUNTER — Telehealth (INDEPENDENT_AMBULATORY_CARE_PROVIDER_SITE_OTHER): Payer: Self-pay | Admitting: Gastroenterology

## 2023-05-11 DIAGNOSIS — K7460 Unspecified cirrhosis of liver: Secondary | ICD-10-CM

## 2023-05-11 NOTE — Telephone Encounter (Signed)
Pt left voicemail needing to schedule 6 month [place] RUQ,   [phone]

## 2023-05-15 NOTE — Addendum Note (Signed)
Addended by: Marlowe Shores on: 05/15/2023 09:56 AM   Modules accepted: Orders

## 2023-05-15 NOTE — Telephone Encounter (Signed)
[place] scheduled for 05/23/23 at 8 am; arrive at 7:45am. NPO after midnight. Pt contacted and verbalized understanding. Gave pt central scheduling number in case she needs to cancel or reschedule.

## 2023-05-23 ENCOUNTER — Ambulatory Visit (HOSPITAL_COMMUNITY): Admission: RE | Admit: 2023-05-23 | Payer: Medicare HMO | Source: Ambulatory Visit

## 2023-05-23 DIAGNOSIS — L508 Other urticaria: Secondary | ICD-10-CM | POA: Diagnosis not present

## 2023-05-28 DIAGNOSIS — Z1231 Encounter for screening mammogram for malignant neoplasm of breast: Secondary | ICD-10-CM | POA: Diagnosis not present

## 2023-06-05 DIAGNOSIS — K7460 Unspecified cirrhosis of liver: Secondary | ICD-10-CM | POA: Diagnosis not present

## 2023-06-05 DIAGNOSIS — D500 Iron deficiency anemia secondary to blood loss (chronic): Secondary | ICD-10-CM | POA: Diagnosis not present

## 2023-06-05 DIAGNOSIS — D696 Thrombocytopenia, unspecified: Secondary | ICD-10-CM | POA: Diagnosis not present

## 2023-06-05 DIAGNOSIS — I8511 Secondary esophageal varices with bleeding: Secondary | ICD-10-CM | POA: Diagnosis not present

## 2023-06-05 DIAGNOSIS — D731 Hypersplenism: Secondary | ICD-10-CM | POA: Diagnosis not present

## 2023-06-13 DIAGNOSIS — E669 Obesity, unspecified: Secondary | ICD-10-CM | POA: Diagnosis not present

## 2023-06-13 DIAGNOSIS — I10 Essential (primary) hypertension: Secondary | ICD-10-CM | POA: Diagnosis not present

## 2023-06-13 DIAGNOSIS — Z6833 Body mass index (BMI) 33.0-33.9, adult: Secondary | ICD-10-CM | POA: Diagnosis not present

## 2023-06-13 DIAGNOSIS — U071 COVID-19: Secondary | ICD-10-CM | POA: Diagnosis not present

## 2023-06-20 ENCOUNTER — Other Ambulatory Visit: Payer: Self-pay

## 2023-06-20 ENCOUNTER — Emergency Department (HOSPITAL_COMMUNITY): Payer: Medicare HMO

## 2023-06-20 ENCOUNTER — Encounter (HOSPITAL_COMMUNITY): Payer: Self-pay | Admitting: *Deleted

## 2023-06-20 ENCOUNTER — Inpatient Hospital Stay (HOSPITAL_COMMUNITY)
Admission: EM | Admit: 2023-06-20 | Discharge: 2023-06-22 | DRG: 682 | Disposition: A | Discharge status: Home / Self Care | Payer: Medicare HMO | Attending: Family Medicine | Admitting: Family Medicine

## 2023-06-20 DIAGNOSIS — R059 Cough, unspecified: Secondary | ICD-10-CM | POA: Diagnosis not present

## 2023-06-20 DIAGNOSIS — K7460 Unspecified cirrhosis of liver: Secondary | ICD-10-CM | POA: Diagnosis present

## 2023-06-20 DIAGNOSIS — E782 Mixed hyperlipidemia: Secondary | ICD-10-CM | POA: Diagnosis present

## 2023-06-20 DIAGNOSIS — E119 Type 2 diabetes mellitus without complications: Secondary | ICD-10-CM | POA: Diagnosis not present

## 2023-06-20 DIAGNOSIS — U071 COVID-19: Secondary | ICD-10-CM | POA: Diagnosis present

## 2023-06-20 DIAGNOSIS — I10 Essential (primary) hypertension: Secondary | ICD-10-CM | POA: Diagnosis present

## 2023-06-20 DIAGNOSIS — E039 Hypothyroidism, unspecified: Secondary | ICD-10-CM | POA: Diagnosis not present

## 2023-06-20 DIAGNOSIS — Z8349 Family history of other endocrine, nutritional and metabolic diseases: Secondary | ICD-10-CM

## 2023-06-20 DIAGNOSIS — G4730 Sleep apnea, unspecified: Secondary | ICD-10-CM | POA: Diagnosis present

## 2023-06-20 DIAGNOSIS — K219 Gastro-esophageal reflux disease without esophagitis: Secondary | ICD-10-CM | POA: Diagnosis not present

## 2023-06-20 DIAGNOSIS — E669 Obesity, unspecified: Secondary | ICD-10-CM | POA: Diagnosis present

## 2023-06-20 DIAGNOSIS — K7581 Nonalcoholic steatohepatitis (NASH): Secondary | ICD-10-CM | POA: Diagnosis present

## 2023-06-20 DIAGNOSIS — I8510 Secondary esophageal varices without bleeding: Secondary | ICD-10-CM | POA: Diagnosis not present

## 2023-06-20 DIAGNOSIS — Z79899 Other long term (current) drug therapy: Secondary | ICD-10-CM | POA: Diagnosis not present

## 2023-06-20 DIAGNOSIS — R4182 Altered mental status, unspecified: Secondary | ICD-10-CM | POA: Insufficient documentation

## 2023-06-20 DIAGNOSIS — Z8719 Personal history of other diseases of the digestive system: Secondary | ICD-10-CM | POA: Diagnosis not present

## 2023-06-20 DIAGNOSIS — Z833 Family history of diabetes mellitus: Secondary | ICD-10-CM | POA: Diagnosis not present

## 2023-06-20 DIAGNOSIS — N179 Acute kidney failure, unspecified: Principal | ICD-10-CM | POA: Diagnosis present

## 2023-06-20 DIAGNOSIS — D696 Thrombocytopenia, unspecified: Secondary | ICD-10-CM | POA: Diagnosis present

## 2023-06-20 DIAGNOSIS — R404 Transient alteration of awareness: Secondary | ICD-10-CM | POA: Diagnosis not present

## 2023-06-20 DIAGNOSIS — Z8249 Family history of ischemic heart disease and other diseases of the circulatory system: Secondary | ICD-10-CM

## 2023-06-20 DIAGNOSIS — E872 Acidosis, unspecified: Secondary | ICD-10-CM | POA: Diagnosis present

## 2023-06-20 DIAGNOSIS — Z79890 Hormone replacement therapy (postmenopausal): Secondary | ICD-10-CM | POA: Diagnosis not present

## 2023-06-20 DIAGNOSIS — Z7984 Long term (current) use of oral hypoglycemic drugs: Secondary | ICD-10-CM | POA: Diagnosis not present

## 2023-06-20 DIAGNOSIS — E875 Hyperkalemia: Secondary | ICD-10-CM | POA: Diagnosis not present

## 2023-06-20 DIAGNOSIS — Z87891 Personal history of nicotine dependence: Secondary | ICD-10-CM

## 2023-06-20 DIAGNOSIS — D649 Anemia, unspecified: Secondary | ICD-10-CM | POA: Diagnosis present

## 2023-06-20 DIAGNOSIS — R001 Bradycardia, unspecified: Secondary | ICD-10-CM | POA: Diagnosis present

## 2023-06-20 DIAGNOSIS — Z8543 Personal history of malignant neoplasm of ovary: Secondary | ICD-10-CM | POA: Diagnosis not present

## 2023-06-20 DIAGNOSIS — E860 Dehydration: Principal | ICD-10-CM | POA: Diagnosis present

## 2023-06-20 DIAGNOSIS — R42 Dizziness and giddiness: Secondary | ICD-10-CM | POA: Diagnosis not present

## 2023-06-20 DIAGNOSIS — Z90710 Acquired absence of both cervix and uterus: Secondary | ICD-10-CM | POA: Diagnosis not present

## 2023-06-20 DIAGNOSIS — D693 Immune thrombocytopenic purpura: Secondary | ICD-10-CM | POA: Insufficient documentation

## 2023-06-20 LAB — LACTIC ACID, PLASMA
Lactic Acid, Venous: 2 mmol/L (ref 0.5–1.9)
Lactic Acid, Venous: 1.4 mmol/L (ref 0.5–1.9)

## 2023-06-20 LAB — COMPREHENSIVE METABOLIC PANEL WITH GFR
ALT: 27 U/L (ref 0–44)
AST: 29 U/L (ref 15–41)
Albumin: 4.1 g/dL (ref 3.5–5.0)
Alkaline Phosphatase: 41 U/L (ref 38–126)
Anion gap: 10 (ref 5–15)
BUN: 42 mg/dL — ABNORMAL HIGH (ref 6–20)
CO2: 21 mmol/L — ABNORMAL LOW (ref 22–32)
Calcium: 9.6 mg/dL (ref 8.9–10.3)
Chloride: 102 mmol/L (ref 98–111)
Creatinine, Ser: 1.92 mg/dL — ABNORMAL HIGH (ref 0.44–1.00)
GFR, Estimated: 30 mL/min — ABNORMAL LOW (ref >60)
Glucose, Bld: 97 mg/dL (ref 70–99)
Potassium: 5.4 mmol/L — ABNORMAL HIGH (ref 3.5–5.1)
Sodium: 133 mmol/L — ABNORMAL LOW (ref 135–145)
Total Bilirubin: 0.4 mg/dL (ref 0.3–1.2)
Total Protein: 7.6 g/dL (ref 6.5–8.1)

## 2023-06-20 LAB — CBC WITH DIFFERENTIAL/PLATELET
Abs Immature Granulocytes: 0.02 10*3/uL (ref 0.00–0.07)
Basophils Absolute: 0 10*3/uL (ref 0.0–0.1)
Basophils Relative: 0 %
Eosinophils Absolute: 0.1 10*3/uL (ref 0.0–0.5)
Eosinophils Relative: 2 %
HCT: 34.9 % — ABNORMAL LOW (ref 36.0–46.0)
Hemoglobin: 11.2 g/dL — ABNORMAL LOW (ref 12.0–15.0)
Immature Granulocytes: 0 %
Lymphocytes Relative: 29 %
Lymphs Abs: 1.5 10*3/uL (ref 0.7–4.0)
MCH: 29.9 pg (ref 26.0–34.0)
MCHC: 32.1 g/dL (ref 30.0–36.0)
MCV: 93.1 fL (ref 80.0–100.0)
Monocytes Absolute: 0.4 10*3/uL (ref 0.1–1.0)
Monocytes Relative: 8 %
Neutro Abs: 3.1 10*3/uL (ref 1.7–7.7)
Neutrophils Relative %: 61 %
Platelets: 88 10*3/uL — ABNORMAL LOW (ref 150–400)
RBC: 3.75 MIL/uL — ABNORMAL LOW (ref 3.87–5.11)
RDW: 14.1 % (ref 11.5–15.5)
WBC: 5.2 10*3/uL (ref 4.0–10.5)
nRBC: 0 % (ref 0.0–0.2)

## 2023-06-20 LAB — AMMONIA: Ammonia: <10 umol/L (ref 9–35)

## 2023-06-20 MED ORDER — LACTATED RINGERS IV BOLUS
2000.0000 mL | Freq: Once | INTRAVENOUS | Status: AC
  Administered 2023-06-20: 2000 mL via INTRAVENOUS

## 2023-06-20 MED ORDER — LACTATED RINGERS IV BOLUS
1000.0000 mL | Freq: Once | INTRAVENOUS | Status: AC
  Administered 2023-06-20: 1000 mL via INTRAVENOUS

## 2023-06-20 MED ORDER — SODIUM ZIRCONIUM CYCLOSILICATE 5 G PO PACK
5.0000 g | PACK | Freq: Once | ORAL | Status: AC
  Administered 2023-06-20: 5 g via ORAL
  Filled 2023-06-20: qty 1

## 2023-06-20 NOTE — H&P (Addendum)
History and Physical    Patient: Priscilla Roberts YQM:[id number] DOB: 05-31-1966 DOA: 06/20/2023 DOS: the patient was seen and examined on 06/21/2023 PCP: Toma Deiters, MD  Patient coming from: Home  Chief Complaint:  Chief Complaint  Patient presents with   Cough   HPI: Priscilla Roberts is a 57 y.o. female with medical history significant of type 2 diabetes mellitus, hypertension, hyperlipidemia, hypothyroidism, GERD, liver cirrhosis secondary to NASH who presents to the emergency department due to persistent productive cough of greenish phlegm and complaining of lightheadedness.  Patient was recently diagnosed with COVID about a week ago.  Patient has had poor oral intake this week and endorsed loss of taste and smell.  Patient was slow to respond to questions at bedside.  She complained of about a week of headache, but denies chest pain, shortness of breath, vomiting, abdominal pain.  ED Course:  In the emergency department, pulse was 57 bpm and patient was hypotensive with a BP of 83/55 on arrival to the ED, other vital signs are within normal range.  Workup in the ED showed normocytic anemia, thrombocytopenia.  BMP showed sodium 133, potassium 5.4, chloride 102, bicarb 21, blood glucose 97, BUN 42, creatinine 1.92 ammonia less than 10, lactic acid 2.0 > 1.4.  Blood culture pending. CT head without contrast showed no acute intracranial process Chest x-ray showed no acute cardiopulmonary abnormality Patient was provided with 2 L of LR, Lokelma was given.  Hospitalist was asked to admit patient for further evaluation and management.  Review of Systems: Review of systems as noted in the HPI. All other systems reviewed and are negative.   Past Medical History:  Diagnosis Date   Anxiety    Arthritis    Cancer (HCC)    ovarian cancer   Diabetes mellitus without complication (HCC)    High cholesterol    Hypertension    Hypothyroidism    Liver cirrhosis secondary to NASH [facility])     Sleep apnea    Thyroid disease    Past Surgical History:  Procedure Laterality Date   ABDOMINAL HYSTERECTOMY     BIOPSY  12/27/2021   Procedure: BIOPSY;  Surgeon: Dolores Frame, MD;  [facility];  Service: Gastroenterology;;   COLONOSCOPY WITH PROPOFOL N/A 10/26/2021   Procedure: COLONOSCOPY WITH PROPOFOL;  Surgeon: Dolores Frame, MD;  [facility];  Service: Gastroenterology;  Laterality: N/A;  10:50   ESOPHAGOGASTRODUODENOSCOPY (EGD) WITH PROPOFOL N/A 10/26/2021   Procedure: ESOPHAGOGASTRODUODENOSCOPY (EGD) WITH PROPOFOL;  Surgeon: Dolores Frame, MD;  [facility]: [facility];  Service: Gastroenterology;  Laterality: N/A;   ESOPHAGOGASTRODUODENOSCOPY (EGD) WITH PROPOFOL N/A 12/27/2021   Procedure: ESOPHAGOGASTRODUODENOSCOPY (EGD) WITH PROPOFOL;  Surgeon: Dolores Frame, MD;  [facility];  Service: Gastroenterology;  Laterality: N/A;  7:30 / Per Dr Levon Hedger needs a 1 hour time slot / follow up (Recall)   ESOPHAGOGASTRODUODENOSCOPY (EGD) WITH PROPOFOL N/A 01/09/2023   Procedure: ESOPHAGOGASTRODUODENOSCOPY (EGD) WITH PROPOFOL;  Surgeon: Dolores Frame, MD;  [facility];  Service: Gastroenterology;  Laterality: N/A;  7:30am;ASA 3   HEMOSTASIS CLIP PLACEMENT  10/26/2021   Procedure: HEMOSTASIS CLIP PLACEMENT;  Surgeon: Dolores Frame, MD;  [facility]: [facility];  Service: Gastroenterology;;   HEMOSTASIS CLIP PLACEMENT  12/27/2021   Procedure: HEMOSTASIS CLIP PLACEMENT;  Surgeon: Dolores Frame, MD;  [facility];  Service: Gastroenterology;;   HEMOSTASIS CLIP PLACEMENT  01/09/2023   Procedure: HEMOSTASIS CLIP PLACEMENT;  Surgeon: Marguerita Merles, Reuel Boom, MD;  [facility];  Service: Gastroenterology;;   JOINT REPLACEMENT Bilateral    bilateral thumb joint replacement   POLYPECTOMY  10/26/2021   Procedure: POLYPECTOMY;  Surgeon: Dolores Frame,  MD;  [facility];  Service: Gastroenterology;;  gastric   POLYPECTOMY  12/27/2021   Procedure: POLYPECTOMY INTESTINAL;  Surgeon: Dolores Frame, MD;  [facility];  Service: Gastroenterology;;   POLYPECTOMY  01/09/2023   Procedure: POLYPECTOMY;  Surgeon: Dolores Frame, MD;  [facility];  Service: Gastroenterology;;   THUMB ARTHROSCOPY      Social History:  reports that she has quit smoking. She has been exposed to tobacco smoke. She has never used smokeless tobacco. She reports that she does not drink alcohol and does not use drugs.   Allergies  Allergen Reactions   Prednisone Other (See Comments)    Mental Status Changes (intolerance), Patient cannot recall this reaction, but recalls feet hurting alfterwards     Tizanidine Other (See Comments)    Mental Status Changes (intolerance),Cannot recall this reaction     Chocolate Flavor Other (See Comments)     Migraine headache    Lorazepam Other (See Comments)    Feels like "bugs are crawling all over me." Emotional and crying, stayed in bed.    Other Rash    Plastic tape Prefers paper tape    Tramadol Other (See Comments)    "feels like bugs crawling on me"    Family History  Problem Relation Age of Onset   Hypertension Mother    Thyroid disease Mother    Diabetes Mother    Heart failure Mother      Prior to Admission medications   Medication Sig Start Date End Date Taking? Authorizing Provider  ACCU-CHEK GUIDE test strip  09/30/21   [provider]  Cholecalciferol (VITAMIN D3 PO) Take 1 tablet by mouth daily.    [provider]  cyanocobalamin 1000 MCG tablet Take 1,000 mcg by mouth daily.    [provider]  dicyclomine (BENTYL) 10 MG capsule Take 1 capsule (10 mg total) by mouth every 12 (twelve) hours as needed (abdominal pain). Patient not taking: Reported on 01/03/2023 04/05/22   Dolores Frame, MD  ferrous sulfate 325 (65  FE) MG tablet Take 325 mg by mouth at bedtime. 01/08/20   [provider]  folic acid (FOLVITE) 1 MG tablet Take 1 mg by mouth daily.    [provider]  Garlic 1000 MG CAPS Take 1,000 mg by mouth daily.    [provider]  levothyroxine (SYNTHROID) 100 MCG tablet Take 100 mcg by mouth daily before breakfast.    [provider]  lisinopril (PRINIVIL,ZESTRIL) 2.5 MG tablet Take 2.5 mg by mouth daily.    [provider]  metFORMIN (GLUCOPHAGE) 1000 MG tablet Take 1,000 mg by mouth 2 (two) times daily with a meal.    [provider]  Omega-3 Fatty Acids (FISH OIL) 1000 MG CAPS Take 1,000 mg by mouth in the morning, at noon, and at bedtime.    [provider]  OZEMPIC, 1 MG/DOSE, 4 MG/3ML SOPN Inject 1 mg into the skin every 7 (seven) days. 12/27/22   [provider]  pantoprazole (PROTONIX) 40 MG tablet Take 40 mg by mouth 2 (two) times daily. 09/04/19   [provider]  PARoxetine (PAXIL) 10 MG tablet Take 10 mg by mouth daily. 09/04/19   [provider]  predniSONE (DELTASONE) 10 MG tablet 5, 4, 3, 2 then 1 tablet by mouth daily for 5 days total. 04/21/23   Idol, Raynelle Fanning, PA-C  pregabalin (LYRICA) 300 MG capsule Take 300 mg by mouth 2 (two) times daily. 03/16/20   [provider]  propranolol (INDERAL) 40 MG tablet Take 40 mg by mouth 2 (two) times daily. 12/02/18   [provider]  rosuvastatin (CRESTOR) 5 MG tablet Take 5 mg by mouth every evening. 09/30/19   [provider]  spironolactone (ALDACTONE) 25 MG tablet Take 25 mg by mouth 2 (two) times daily.    [provider]  topiramate (TOPAMAX) 50 MG tablet Take 50 mg by mouth 2 (two) times daily.    [provider]    Physical Exam: BP (!) 137/57 (BP Location: Left Arm)   Pulse (!) 58   Temp 98.3 F (36.8 C) (Oral)   Resp 18   Ht 5\' 7"  (1.702 m)   Wt 94.3 kg   SpO2 100%   BMI 32.58 kg/m   General: 57 y.o.  year-old female well developed well nourished in no acute distress.  She was slow to respond to questions.  Alert and oriented x3. HEENT: NCAT, EOMI, dry mucous membrane. Neck: Supple, trachea medial Cardiovascular: Regular rate and rhythm with no rubs or gallops.  No thyromegaly or JVD noted.  No lower extremity edema. 2/4 pulses in all 4 extremities. Respiratory: Clear to auscultation with no wheezes or rales. Good inspiratory effort. Abdomen: Soft, nontender nondistended with normal bowel sounds x4 quadrants. Muskuloskeletal: No cyanosis, clubbing or edema noted bilaterally Neuro: CN II-XII intact, strength 5/5 x 4, sensation, reflexes intact Skin: No ulcerative lesions noted or rashes Psychiatry: Judgement and insight appear normal. Mood is appropriate for condition and setting          Labs on Admission:  Basic Metabolic Panel: Recent Labs  Lab 06/20/23 2022  NA 133*  K 5.4*  CL 102  CO2 21*  GLUCOSE 97  BUN 42*  CREATININE 1.92*  CALCIUM 9.6   Liver Function Tests: Recent Labs  Lab 06/20/23 2022  AST 29  ALT 27  ALKPHOS 41  BILITOT 0.4  PROT 7.6  ALBUMIN 4.1   No results for input(s): "LIPASE", "AMYLASE" in the last 168 hours. Recent Labs  Lab 06/20/23 2234  AMMONIA <10   CBC: Recent Labs  Lab 06/20/23 2022  WBC 5.2  NEUTROABS 3.1  HGB 11.2*  HCT 34.9*  MCV 93.1  PLT 88*   Cardiac Enzymes: No results for input(s): "CKTOTAL", "CKMB", "CKMBINDEX", "TROPONINI" in the last 168 hours.  BNP (last 3 results) No results for input(s): "BNP" in the last 8760 hours.  ProBNP (last 3 results) No results for input(s): "PROBNP" in the last 8760 hours.  CBG: No results for input(s): "GLUCAP" in the last 168 hours.  Radiological Exams on Admission: CT Head Wo Contrast  Result Date: 06/20/2023 CLINICAL DATA:  Altered level of consciousness, productive cough, dizziness EXAM: CT HEAD WITHOUT CONTRAST TECHNIQUE: Contiguous axial images were obtained from the  base of the skull through the vertex without intravenous contrast. RADIATION DOSE REDUCTION: This exam was performed according to the departmental dose-optimization program which includes automated exposure control, adjustment of the mA and/or kV according to patient size and/or use of iterative reconstruction technique. COMPARISON:  11/14/2014 FINDINGS: Brain: No acute infarct or hemorrhage. Lateral ventricles and midline structures are unremarkable. No acute extra-axial fluid collections. No mass effect. Vascular: No hyperdense vessel or unexpected  calcification. Skull: Normal. Negative for fracture or focal lesion. Sinuses/Orbits: No acute finding. Other: None. IMPRESSION: 1. No acute intracranial process. Electronically Signed   By: Sharlet Salina M.D.   On: 06/20/2023 22:30   DG Chest 2 View  Result Date: 06/20/2023 CLINICAL DATA:  cough, + covid 9/7 EXAM: CHEST - 2 VIEW COMPARISON:  August 14, 2021 FINDINGS: The cardiomediastinal silhouette is unchanged in contour.Severe atherosclerotic calcifications. No pleural effusion. No pneumothorax. No acute pleuroparenchymal abnormality. Visualized abdomen is unremarkable. Mild multilevel degenerative changes of the thoracic spine. IMPRESSION: No acute cardiopulmonary abnormality. Electronically Signed   By: Meda Klinefelter M.D.   On: 06/20/2023 14:56    EKG: I independently viewed the EKG done and my findings are as followed: Normal sinus rhythm at a rate of 61 bpm  Assessment/Plan Present on Admission:  Dehydration  Essential hypertension  Mixed hyperlipidemia  Acquired hypothyroidism  Liver cirrhosis secondary to NASH [facility])  Principal Problem:   AKI (acute kidney injury) (HCC) Active Problems:   Mixed hyperlipidemia   Essential hypertension   Diabetes mellitus (HCC)   Acquired hypothyroidism   Liver cirrhosis secondary to NASH (HCC)   History of esophageal varices   Dehydration   Hyperkalemia   GERD (gastroesophageal reflux disease)    Altered mental status  Acute kidney injury Dehydration BUN/creatinine at 42/1.92 (baseline creatinine at 0.9-1.0) Continue gentle hydration Renally adjust medications, avoid nephrotoxic agents/dehydration/hypotension  Lactic acidosis-resolved This is possibly due to above Lactic acid 2.0 > 1.4  Altered mental status- Improved Patient was reported to be more confused on arrival to the ED, this seems to have improved at bedside Possibly multifactorial including acute kidney injury, dehydration Continue IV hydration as described above for acute kidney injury and dehydration  Hyperkalemia K+ 5.4, IV hydration provided Potassium will be rechecked and level will be adjusted accordingly  Chronic thrombocytopenia Platelets 88, stable, no sign of any bleeding Continue to monitor platelet levels with morning labs  Essential hypertension Continue propranolol Lisinopril currently held due to acute kidney injury  Mixed hyperlipidemia Continue Crestor  Acquired hypothyroidism Continue Synthroid  Type 2 diabetes mellitus Continue ISS and hypoglycemia protocol  Liver cirrhosis due to NASH Stable, spironolactone temporarily held due to acute kidney injury and dehydration  History of esophageal varices Continue propranolol  GERD Continue Protonix  DVT prophylaxis: SCDs  Advance Care Planning: CODE STATUS: Full code  Consults: None  Family Communication: None at bedside  Severity of Illness: The appropriate patient status for this patient is INPATIENT. Inpatient status is judged to be reasonable and necessary in order to provide the required intensity of service to ensure the patient's safety. The patient's presenting symptoms, physical exam findings, and initial radiographic and laboratory data in the context of their chronic comorbidities is felt to place them at high risk for further clinical deterioration. Furthermore, it is not anticipated that the patient will be medically  stable for discharge from the hospital within 2 midnights of admission.   * I certify that at the point of admission it is my clinical judgment that the patient will require inpatient hospital care spanning beyond 2 midnights from the point of admission due to high intensity of service, high risk for further deterioration and high frequency of surveillance required.*  Author: Frankey Shown, DO 06/21/2023 3:40 AM  For on call review [email or web address].

## 2023-06-20 NOTE — ED Notes (Signed)
Patient transported to CT 

## 2023-06-20 NOTE — H&P (Incomplete)
History and Physical    Patient: Priscilla Roberts EAV:[id number] DOB: [birth year] DOA: 06/20/2023 DOS: the patient was seen and examined on 06/20/2023 PCP: Toma Deiters, MD  Patient coming from: {Point_of_Origin:[address]}  Chief Complaint:  Chief Complaint  Patient presents with  . Cough   HPI: KATALEYA Roberts is a 57 y.o. female with medical history significant of ***  Review of Systems: {ROS_Text:26778} Past Medical History:  Diagnosis Date  . Anxiety   . Arthritis   . Cancer (HCC)    ovarian cancer  . Diabetes mellitus without complication (HCC)   . High cholesterol   . Hypertension   . Hypothyroidism   . Liver cirrhosis secondary to NASH (HCC)   . Sleep apnea   . Thyroid disease    Past Surgical History:  Procedure Laterality Date  . ABDOMINAL HYSTERECTOMY    . BIOPSY  12/27/2021   Procedure: BIOPSY;  Surgeon: Dolores Frame, MD;  Location: [facility];  Service: Gastroenterology;;  . COLONOSCOPY WITH PROPOFOL N/A 10/26/2021   Procedure: COLONOSCOPY WITH PROPOFOL;  Surgeon: Dolores Frame, MD;  Location: [facility];  Service: Gastroenterology;  Laterality: N/A;  10:50  . ESOPHAGOGASTRODUODENOSCOPY (EGD) WITH PROPOFOL N/A 10/26/2021   Procedure: ESOPHAGOGASTRODUODENOSCOPY (EGD) WITH PROPOFOL;  Surgeon: Dolores Frame, MD;  Location: [facility];  Service: Gastroenterology;  Laterality: N/A;  . ESOPHAGOGASTRODUODENOSCOPY (EGD) WITH PROPOFOL N/A 12/27/2021   Procedure: ESOPHAGOGASTRODUODENOSCOPY (EGD) WITH PROPOFOL;  Surgeon: Dolores Frame, MD;  Location: [facility];  Service: Gastroenterology;  Laterality: N/A;  7:30 / Per Dr Levon Hedger needs a 1 hour time slot / follow up (Recall)  . ESOPHAGOGASTRODUODENOSCOPY (EGD) WITH PROPOFOL N/A 01/09/2023   Procedure: ESOPHAGOGASTRODUODENOSCOPY (EGD) WITH PROPOFOL;  Surgeon: Dolores Frame, MD;  Location: [facility];  Service: Gastroenterology;  Laterality: N/A;   7:30am;ASA 3  . HEMOSTASIS CLIP PLACEMENT  10/26/2021   Procedure: HEMOSTASIS CLIP PLACEMENT;  Surgeon: Dolores Frame, MD;  Location: [facility];  Service: Gastroenterology;;  . HEMOSTASIS CLIP PLACEMENT  12/27/2021   Procedure: HEMOSTASIS CLIP PLACEMENT;  Surgeon: Dolores Frame, MD;  Location: [facility];  Service: Gastroenterology;;  . HEMOSTASIS CLIP PLACEMENT  01/09/2023   Procedure: HEMOSTASIS CLIP PLACEMENT;  Surgeon: Dolores Frame, MD;  Location: [facility];  Service: Gastroenterology;;  . JOINT REPLACEMENT Bilateral    bilateral thumb joint replacement  . POLYPECTOMY  10/26/2021   Procedure: POLYPECTOMY;  Surgeon: Dolores Frame, MD;  Location: [facility];  Service: Gastroenterology;;  gastric  . POLYPECTOMY  12/27/2021   Procedure: POLYPECTOMY INTESTINAL;  Surgeon: Dolores Frame, MD;  Location: [facility];  Service: Gastroenterology;;  . POLYPECTOMY  01/09/2023   Procedure: POLYPECTOMY;  Surgeon: Marguerita Merles, Reuel Boom, MD;  Location: [facility];  Service: Gastroenterology;;  . THUMB ARTHROSCOPY     Social History:  reports that she has quit smoking. She has been exposed to tobacco smoke. She has never used smokeless tobacco. She reports that she does not drink alcohol and does not use drugs.  Allergies  Allergen Reactions  . Prednisone Other (See Comments)    Mental Status Changes (intolerance), Patient cannot recall this reaction, but recalls feet hurting alfterwards    . Tizanidine Other (See Comments)    Mental Status Changes (intolerance),Cannot recall this reaction    . Chocolate Flavor Other (See Comments)     Migraine headache   . Lorazepam Other (See Comments)    Feels like "bugs  are crawling all over me." Emotional and crying, stayed in bed.   . Other Rash    Plastic tape Prefers paper tape   . Tramadol Other (See Comments)    "feels like bugs crawling on me"    Family History   Problem Relation Age of Onset  . Hypertension Mother   . Thyroid disease Mother   . Diabetes Mother   . Heart failure Mother     Prior to Admission medications   Medication Sig Start Date End Date Taking? Authorizing Provider  ACCU-CHEK GUIDE test strip  09/30/21   [provider]  Cholecalciferol (VITAMIN D3 PO) Take 1 tablet by mouth daily.    [provider]  cyanocobalamin 1000 MCG tablet Take 1,000 mcg by mouth daily.    [provider]  dicyclomine (BENTYL) 10 MG capsule Take 1 capsule (10 mg total) by mouth every 12 (twelve) hours as needed (abdominal pain). Patient not taking: Reported on 01/03/2023 04/05/22   Dolores Frame, MD  ferrous sulfate 325 (65 FE) MG tablet Take 325 mg by mouth at bedtime. 01/08/20   [provider]  folic acid (FOLVITE) 1 MG tablet Take 1 mg by mouth daily.    [provider]  Garlic 1000 MG CAPS Take 1,000 mg by mouth daily.    [provider]  levothyroxine (SYNTHROID) 100 MCG tablet Take 100 mcg by mouth daily before breakfast.    [provider]  lisinopril (PRINIVIL,ZESTRIL) 2.5 MG tablet Take 2.5 mg by mouth daily.    [provider]  metFORMIN (GLUCOPHAGE) 1000 MG tablet Take 1,000 mg by mouth 2 (two) times daily with a meal.    [provider]  Omega-3 Fatty Acids (FISH OIL) 1000 MG CAPS Take 1,000 mg by mouth in the morning, at noon, and at bedtime.    [provider]  OZEMPIC, 1 MG/DOSE, 4 MG/3ML SOPN Inject 1 mg into the skin every 7 (seven) days. 12/27/22   [provider]  pantoprazole (PROTONIX) 40 MG tablet Take 40 mg by mouth 2 (two) times daily. 09/04/19   [provider]  PARoxetine (PAXIL) 10 MG tablet Take 10 mg by mouth daily. 09/04/19   [provider]  predniSONE (DELTASONE) 10 MG tablet 5, 4, 3, 2 then 1 tablet by mouth daily for 5 days total. 04/21/23   Idol, Raynelle Fanning, PA-C  pregabalin (LYRICA) 300 MG capsule Take  300 mg by mouth 2 (two) times daily. 03/16/20   [provider]  propranolol (INDERAL) 40 MG tablet Take 40 mg by mouth 2 (two) times daily. 12/02/18   [provider]  rosuvastatin (CRESTOR) 5 MG tablet Take 5 mg by mouth every evening. 09/30/19   [provider]  spironolactone (ALDACTONE) 25 MG tablet Take 25 mg by mouth 2 (two) times daily.    [provider]  topiramate (TOPAMAX) 50 MG tablet Take 50 mg by mouth 2 (two) times daily.    [provider]    Physical Exam: Vitals:   06/20/23 2030 06/20/23 2226 06/20/23 2245 06/20/23 2300  BP: (!) 86/56 131/86 123/61 130/63  Pulse: (!) 54 (!) 58 (!) 58 (!) 59  Resp: 19 19  19   Temp:      TempSrc:      SpO2: 97% 99% 99% 98%  Weight:      Height:       *** Data Reviewed: {Tip this will not be part of the note when signed- Document your independent  interpretation of telemetry tracing, EKG, lab, Radiology test or any other diagnostic tests. Add any new diagnostic test ordered today. (Optional):26781} {Results:26384}  Assessment and Plan: No notes have been filed under this hospital service. Service: Hospitalist     Advance Care Planning:   Code Status: Not on file ***  Consults: ***  Family Communication: ***  Severity of Illness: {Observation/Inpatient:21159}  Author: Frankey Shown, DO 06/20/2023 11:22 PM  For on call review [email or web address].

## 2023-06-20 NOTE — ED Triage Notes (Signed)
Pt with productive cough of green phlegm and c/o dizziness.  Pt tested positive for Covid on 9/7.

## 2023-06-20 NOTE — ED Notes (Signed)
Lab in room.

## 2023-06-20 NOTE — ED Provider Notes (Signed)
Priscilla Roberts AT [facility] Provider Note   CSN: [id number] Arrival date & time: 06/20/23  1331     History  Chief Complaint  Patient presents with   Cough    Priscilla Roberts is a 57 y.o. female with a history including hypertension, diabetes, liver cirrhosis secondary to Elita Boone who was diagnosed with COVID 1 week ago presenting for evaluation of a persistent productive cough and complaint of lightheadedness.  Her husband at the bedside also notes that she is "not herself", stating at times she seems confused and just not "with it".  He states she has been slow to respond to him when he talks to her and has been forgetful this week.  She endorses having poor p.o. intake this week, states she has lost her sense of taste and smell and has had to force herself to eat.  She denies chest pain, shortness of breath, abdominal pain, denies vomiting or diarrhea.  She does have a headache across the crown of her head which she states has been there for a week.  She denies neck pain or stiffness, no dysuria.  The history is provided by the patient and the spouse.       Home Medications Prior to Admission medications   Medication Sig Start Date End Date Taking? Authorizing Provider  ACCU-CHEK GUIDE test strip  09/30/21   [provider]  Cholecalciferol (VITAMIN D3 PO) Take 1 tablet by mouth daily.    [provider]  cyanocobalamin 1000 MCG tablet Take 1,000 mcg by mouth daily.    [provider]  dicyclomine (BENTYL) 10 MG capsule Take 1 capsule (10 mg total) by mouth every 12 (twelve) hours as needed (abdominal pain). Patient not taking: Reported on 01/03/2023 04/05/22   Dolores Frame, MD  ferrous sulfate 325 (65 FE) MG tablet Take 325 mg by mouth at bedtime. 01/08/20   [provider]  folic acid (FOLVITE) 1 MG tablet Take 1 mg by mouth daily.    [provider]  Garlic 1000 MG CAPS Take 1,000 mg by mouth daily.     [provider]  levothyroxine (SYNTHROID) 100 MCG tablet Take 100 mcg by mouth daily before breakfast.    [provider]  lisinopril (PRINIVIL,ZESTRIL) 2.5 MG tablet Take 2.5 mg by mouth daily.    [provider]  metFORMIN (GLUCOPHAGE) 1000 MG tablet Take 1,000 mg by mouth 2 (two) times daily with a meal.    [provider]  Omega-3 Fatty Acids (FISH OIL) 1000 MG CAPS Take 1,000 mg by mouth in the morning, at noon, and at bedtime.    [provider]  OZEMPIC, 1 MG/DOSE, 4 MG/3ML SOPN Inject 1 mg into the skin every 7 (seven) days. 12/27/22   [provider]  pantoprazole (PROTONIX) 40 MG tablet Take 40 mg by mouth 2 (two) times daily. 09/04/19   [provider]  PARoxetine (PAXIL) 10 MG tablet Take 10 mg by mouth daily. 09/04/19   [provider]  predniSONE (DELTASONE) 10 MG tablet 5, 4, 3, 2 then 1 tablet by mouth daily for 5 days total. 04/21/23   Ellamarie Naeve, Raynelle Fanning, PA-C  pregabalin (LYRICA) 300 MG capsule Take 300 mg by mouth 2 (two) times daily. 03/16/20   [provider]  propranolol (INDERAL) 40 MG tablet Take 40 mg by mouth 2 (two) times daily. 12/02/18   [provider]  rosuvastatin (CRESTOR) 5 MG tablet Take 5 mg by mouth every  evening. 09/30/19   [provider]  spironolactone (ALDACTONE) 25 MG tablet Take 25 mg by mouth 2 (two) times daily.    [provider]  topiramate (TOPAMAX) 50 MG tablet Take 50 mg by mouth 2 (two) times daily.    [provider]      Allergies    Prednisone, Tizanidine, Chocolate flavor, Lorazepam, Other, and Tramadol    Review of Systems   Review of Systems  Constitutional:  Negative for fever.  HENT:  Negative for congestion and sore throat.   Eyes: Negative.   Respiratory:  Positive for cough. Negative for chest tightness and shortness of breath.   Cardiovascular:  Negative for chest pain.  Gastrointestinal:  Negative for abdominal pain and  nausea.  Genitourinary: Negative.   Musculoskeletal:  Negative for arthralgias, joint swelling and neck pain.  Skin: Negative.  Negative for rash and wound.  Neurological:  Positive for light-headedness. Negative for dizziness, weakness, numbness and headaches.  Psychiatric/Behavioral:  Positive for confusion.     Physical Exam Updated Vital Signs BP 130/63   Pulse (!) 59   Temp 97.6 F (36.4 C) (Oral)   Resp 19   Ht 5\' 7"  (1.702 m)   Wt 96.2 kg   SpO2 98%   BMI 33.20 kg/m  Physical Exam Vitals and nursing note reviewed.  Constitutional:      General: She is awake.     Appearance: She is well-developed.     Comments: Staring affect,  slow to respond to direct questions, but responds appropriately,  few episodes of looking at husband for answers.   HENT:     Head: Normocephalic and atraumatic.  Eyes:     Conjunctiva/sclera: Conjunctivae normal.  Cardiovascular:     Rate and Rhythm: Normal rate and regular rhythm.     Heart sounds: Normal heart sounds.  Pulmonary:     Effort: Pulmonary effort is normal.     Breath sounds: Normal breath sounds. No wheezing.  Abdominal:     General: Bowel sounds are normal.     Palpations: Abdomen is soft.     Tenderness: There is no abdominal tenderness.  Musculoskeletal:        General: Normal range of motion.     Cervical back: Normal range of motion. No rigidity.  Skin:    General: Skin is warm and dry.  Neurological:     Mental Status: She is oriented to person, place, and time.     Cranial Nerves: Cranial nerves 2-12 are intact.     Motor: No weakness.     Comments: Equal grip strength.  No asterixis     ED Results / Procedures / Treatments   Labs (all labs ordered are listed, but only abnormal results are displayed) Labs Reviewed  CBC WITH DIFFERENTIAL/PLATELET - Abnormal; Notable for the following components:      Result Value   RBC 3.75 (*)    Hemoglobin 11.2 (*)    HCT 34.9 (*)    Platelets 88 (*)    All other  components within normal limits  COMPREHENSIVE METABOLIC PANEL - Abnormal; Notable for the following components:   Sodium 133 (*)    Potassium 5.4 (*)    CO2 21 (*)    BUN 42 (*)    Creatinine, Ser 1.92 (*)    GFR, Estimated 30 (*)    All other components within normal limits  LACTIC ACID, PLASMA - Abnormal; Notable for the following components:   Lactic Acid, Venous  2.0 (*)    All other components within normal limits  CULTURE, BLOOD (ROUTINE X 2)  CULTURE, BLOOD (ROUTINE X 2)  AMMONIA  LACTIC ACID, PLASMA  URINALYSIS, ROUTINE W REFLEX MICROSCOPIC    EKG None  Radiology CT Head Wo Contrast  Result Date: 06/20/2023 CLINICAL DATA:  Altered level of consciousness, productive cough, dizziness EXAM: CT HEAD WITHOUT CONTRAST TECHNIQUE: Contiguous axial images were obtained from the base of the skull through the vertex without intravenous contrast. RADIATION DOSE REDUCTION: This exam was performed according to the departmental dose-optimization program which includes automated exposure control, adjustment of the mA and/or kV according to patient size and/or use of iterative reconstruction technique. COMPARISON:  11/14/2014 FINDINGS: Brain: No acute infarct or hemorrhage. Lateral ventricles and midline structures are unremarkable. No acute extra-axial fluid collections. No mass effect. Vascular: No hyperdense vessel or unexpected calcification. Skull: Normal. Negative for fracture or focal lesion. Sinuses/Orbits: No acute finding. Other: None. IMPRESSION: 1. No acute intracranial process. Electronically Signed   By: Sharlet Salina M.D.   On: 06/20/2023 22:30   DG Chest 2 View  Result Date: 06/20/2023 CLINICAL DATA:  cough, + covid 9/7 EXAM: CHEST - 2 VIEW COMPARISON:  August 14, 2021 FINDINGS: The cardiomediastinal silhouette is unchanged in contour.Severe atherosclerotic calcifications. No pleural effusion. No pneumothorax. No acute pleuroparenchymal abnormality. Visualized abdomen is  unremarkable. Mild multilevel degenerative changes of the thoracic spine. IMPRESSION: No acute cardiopulmonary abnormality. Electronically Signed   By: Meda Klinefelter M.D.   On: 06/20/2023 14:56    Procedures Procedures    Medications Ordered in ED Medications  sodium zirconium cyclosilicate (LOKELMA) packet 5 g (has no administration in time range)  lactated ringers bolus 1,000 mL (0 mLs Intravenous Stopped 06/20/23 2126)  lactated ringers bolus 2,000 mL (2,000 mLs Intravenous New Bag/Given 06/20/23 2136)    ED Course/ Medical Decision Making/ A&P                                 Medical Decision Making Patient presenting with persistent productive cough, dizziness and weakness since being diagnosed with COVID 11 days ago.  Husband at bedside endorses patient has some subtle confusion which has been worsening in recent days.  Poor p.o. intake.  Clinically she does appear dehydrated, her vital signs have been relatively stable, although she is borderline bradycardic blood pressures have been good, she has been afebrile with normal oxygen saturations.  Normal respiratory rate.  Her labs are significant for dehydration, she has a BUN of 42 and a creatinine of 1.92 with a GFR of 30, this is an compared 1.01 collected 1 year ago.  She also has a potassium of 5.4. sample does not appear to be hemolyzed, EKG pending.  Lokelma ordered.  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  Amount and/or Complexity of Data Reviewed Labs: ordered.    Details: Labs significant for a potassium of 5.4,  BUN of 42 and a creatinine of 1.92 suggesting significant dehydration and acute kidney injury associated.  She also has a platelet count of 88, this appears to be a chronic finding.  Her ammonia level is less than 10 she has an elevated lactate at 2.0, however she does not meet SIRS criteria, I suspect this elevated lactate is secondary to dehydration,  this is not sepsis.   Radiology: ordered.    Details: Chest  x-ray is clear, CT head negative for acute intracranial findings. Discussion of management or test interpretation with external provider(s): Patient was discussed with Dr. Thomes Dinning who accepts patient for admission.  Risk Decision regarding hospitalization.           Final Clinical Impression(s) / ED Diagnoses Final diagnoses:  Dehydration  Acute kidney injury (HCC)  Altered mental status, unspecified altered mental status type    Rx / DC Orders ED Discharge Orders     None         Victoriano Lain 06/20/23 2322    Derwood Kaplan, MD 06/22/23 1022

## 2023-06-21 DIAGNOSIS — E875 Hyperkalemia: Secondary | ICD-10-CM | POA: Insufficient documentation

## 2023-06-21 DIAGNOSIS — N179 Acute kidney failure, unspecified: Secondary | ICD-10-CM

## 2023-06-21 DIAGNOSIS — R4182 Altered mental status, unspecified: Secondary | ICD-10-CM | POA: Insufficient documentation

## 2023-06-21 DIAGNOSIS — K219 Gastro-esophageal reflux disease without esophagitis: Secondary | ICD-10-CM | POA: Insufficient documentation

## 2023-06-21 LAB — COMPREHENSIVE METABOLIC PANEL
ALT: 26 U/L (ref 0–44)
AST: 28 U/L (ref 15–41)
Albumin: 3.5 g/dL (ref 3.5–5.0)
Alkaline Phosphatase: 34 U/L — ABNORMAL LOW (ref 38–126)
Anion gap: 9 (ref 5–15)
BUN: 39 mg/dL — ABNORMAL HIGH (ref 6–20)
CO2: 22 mmol/L (ref 22–32)
Calcium: 8.8 mg/dL — ABNORMAL LOW (ref 8.9–10.3)
Chloride: 101 mmol/L (ref 98–111)
Creatinine, Ser: 1.51 mg/dL — ABNORMAL HIGH (ref 0.44–1.00)
GFR, Estimated: 40 mL/min — ABNORMAL LOW (ref >60)
Glucose, Bld: 75 mg/dL (ref 70–99)
Potassium: 5.4 mmol/L — ABNORMAL HIGH (ref 3.5–5.1)
Sodium: 132 mmol/L — ABNORMAL LOW (ref 135–145)
Total Bilirubin: 0.3 mg/dL (ref 0.3–1.2)
Total Protein: 6.2 g/dL — ABNORMAL LOW (ref 6.5–8.1)

## 2023-06-21 LAB — CBC
HCT: 30.9 % — ABNORMAL LOW (ref 36.0–46.0)
Hemoglobin: 10 g/dL — ABNORMAL LOW (ref 12.0–15.0)
MCH: 30.1 pg (ref 26.0–34.0)
MCHC: 32.4 g/dL (ref 30.0–36.0)
MCV: 93.1 fL (ref 80.0–100.0)
Platelets: 51 10*3/uL — ABNORMAL LOW (ref 150–400)
RBC: 3.32 MIL/uL — ABNORMAL LOW (ref 3.87–5.11)
RDW: 14 % (ref 11.5–15.5)
WBC: 2.8 10*3/uL — ABNORMAL LOW (ref 4.0–10.5)
nRBC: 0 % (ref 0.0–0.2)

## 2023-06-21 LAB — HEMOGLOBIN A1C
Hgb A1c MFr Bld: 5.8 % — ABNORMAL HIGH (ref 4.8–5.6)
Mean Plasma Glucose: 119.76 mg/dL

## 2023-06-21 LAB — GLUCOSE, CAPILLARY
Glucose-Capillary: 76 mg/dL (ref 70–99)
Glucose-Capillary: 75 mg/dL (ref 70–99)
Glucose-Capillary: 86 mg/dL (ref 70–99)
Glucose-Capillary: 122 mg/dL — ABNORMAL HIGH (ref 70–99)
Glucose-Capillary: 172 mg/dL — ABNORMAL HIGH (ref 70–99)

## 2023-06-21 LAB — HIV ANTIBODY (ROUTINE TESTING W REFLEX): HIV Screen 4th Generation wRfx: NONREACTIVE

## 2023-06-21 LAB — PHOSPHORUS: Phosphorus: 4.2 mg/dL (ref 2.5–4.6)

## 2023-06-21 LAB — MAGNESIUM: Magnesium: 1.5 mg/dL — ABNORMAL LOW (ref 1.7–2.4)

## 2023-06-21 MED ORDER — PANTOPRAZOLE SODIUM 40 MG PO TBEC
40.0000 mg | DELAYED_RELEASE_TABLET | Freq: Two times a day (BID) | ORAL | Status: DC
  Administered 2023-06-21 – 2023-06-22 (×2): 40 mg via ORAL
  Filled 2023-06-21 (×2): qty 1

## 2023-06-21 MED ORDER — INSULIN ASPART 100 UNIT/ML IJ SOLN: 0.0000 [IU] | Freq: Three times a day (TID) | INTRAMUSCULAR | Status: DC

## 2023-06-21 MED ORDER — METOPROLOL TARTRATE 5 MG/5ML IV SOLN: 5.0000 mg | INTRAVENOUS | Status: DC | PRN

## 2023-06-21 MED ORDER — SODIUM CHLORIDE 0.9 % IV SOLN: INTRAVENOUS | Status: DC

## 2023-06-21 MED ORDER — ACETAMINOPHEN 325 MG PO TABS
650.0000 mg | ORAL_TABLET | Freq: Once | ORAL | Status: AC | PRN
  Administered 2023-06-21: 650 mg via ORAL
  Filled 2023-06-21: qty 2

## 2023-06-21 MED ORDER — ROSUVASTATIN CALCIUM 10 MG PO TABS
5.0000 mg | ORAL_TABLET | Freq: Every evening | ORAL | Status: DC
  Administered 2023-06-21: 5 mg via ORAL
  Filled 2023-06-21: qty 1

## 2023-06-21 MED ORDER — LEVOTHYROXINE SODIUM 100 MCG PO TABS
100.0000 ug | ORAL_TABLET | Freq: Every day | ORAL | Status: DC
  Administered 2023-06-22: 100 ug via ORAL
  Filled 2023-06-21: qty 1

## 2023-06-21 MED ORDER — SENNOSIDES-DOCUSATE SODIUM 8.6-50 MG PO TABS: 1.0000 | ORAL_TABLET | Freq: Every evening | ORAL | Status: DC | PRN

## 2023-06-21 MED ORDER — PROPRANOLOL HCL 20 MG PO TABS
40.0000 mg | ORAL_TABLET | Freq: Two times a day (BID) | ORAL | Status: DC
  Administered 2023-06-21 – 2023-06-22 (×2): 40 mg via ORAL
  Filled 2023-06-21 (×2): qty 2

## 2023-06-21 MED ORDER — ONDANSETRON HCL 4 MG/2ML IJ SOLN: 4.0000 mg | Freq: Four times a day (QID) | INTRAMUSCULAR | Status: DC | PRN

## 2023-06-21 MED ORDER — HYDROCOD POLI-CHLORPHE POLI ER 10-8 MG/5ML PO SUER
5.0000 mL | Freq: Two times a day (BID) | ORAL | Status: DC | PRN
  Administered 2023-06-21: 5 mL via ORAL
  Filled 2023-06-21: qty 5

## 2023-06-21 MED ORDER — TRAZODONE HCL 50 MG PO TABS
50.0000 mg | ORAL_TABLET | Freq: Every evening | ORAL | Status: DC | PRN
  Filled 2023-06-21: qty 1

## 2023-06-21 MED ORDER — GLUCAGON HCL RDNA (DIAGNOSTIC) 1 MG IJ SOLR: 1.0000 mg | INTRAMUSCULAR | Status: DC | PRN

## 2023-06-21 MED ORDER — HYDRALAZINE HCL 20 MG/ML IJ SOLN: 10.0000 mg | INTRAMUSCULAR | Status: DC | PRN

## 2023-06-21 MED ORDER — OMEGA-3-ACID ETHYL ESTERS 1 G PO CAPS
1.0000 g | ORAL_CAPSULE | Freq: Every day | ORAL | Status: DC
  Administered 2023-06-21 – 2023-06-22 (×2): 1 g via ORAL
  Filled 2023-06-21 (×2): qty 1

## 2023-06-21 MED ORDER — INFLUENZA VIRUS VACC SPLIT PF (FLUZONE) 0.5 ML IM SUSY: 0.5000 mL | PREFILLED_SYRINGE | INTRAMUSCULAR | Status: DC

## 2023-06-21 MED ORDER — IPRATROPIUM-ALBUTEROL 0.5-2.5 (3) MG/3ML IN SOLN: 3.0000 mL | RESPIRATORY_TRACT | Status: DC | PRN

## 2023-06-21 MED ORDER — ADULT MULTIVITAMIN W/MINERALS CH
1.0000 | ORAL_TABLET | Freq: Every day | ORAL | Status: DC
  Administered 2023-06-21 – 2023-06-22 (×2): 1 via ORAL
  Filled 2023-06-21 (×2): qty 1

## 2023-06-21 MED ORDER — GUAIFENESIN 100 MG/5ML PO LIQD
5.0000 mL | ORAL | Status: DC | PRN
  Administered 2023-06-21: 5 mL via ORAL
  Filled 2023-06-21 (×2): qty 5

## 2023-06-21 MED ORDER — GLUCERNA SHAKE PO LIQD
237.0000 mL | Freq: Two times a day (BID) | ORAL | Status: DC
  Administered 2023-06-21 – 2023-06-22 (×2): 237 mL via ORAL

## 2023-06-21 MED ORDER — ONDANSETRON HCL 4 MG PO TABS: 4.0000 mg | ORAL_TABLET | Freq: Four times a day (QID) | ORAL | Status: DC | PRN

## 2023-06-21 MED ORDER — PNEUMOCOCCAL 20-VAL CONJ VACC 0.5 ML IM SUSY: 0.5000 mL | PREFILLED_SYRINGE | INTRAMUSCULAR | Status: DC

## 2023-06-21 NOTE — Progress Notes (Signed)
PROGRESS NOTE    Priscilla Roberts  EPP:[id number] DOB: Mar 25, 1966 DOA: 06/20/2023 PCP: Toma Deiters, MD   Brief Narrative:  57 year old with history of DM2, HTN, HLD, hyponatremia, GERD, NASH cirrhosis comes to the ED for productive cough, greenish phlegm and lightheadedness.  Patient was diagnosed with COVID-19 about a week ago and since then has had poor oral intake.  Upon admission noted to be hypotensive, hyperkalemia, lactic acidosis and confusion.  CT of the head and chest x-ray were unremarkable.  Started on IV fluids due to concerns of AKI   Assessment & Plan:  Principal Problem:   AKI (acute kidney injury) (HCC) Active Problems:   Mixed hyperlipidemia   Essential hypertension   Diabetes mellitus (HCC)   Acquired hypothyroidism   Liver cirrhosis secondary to NASH (HCC)   History of esophageal varices   Dehydration   Hyperkalemia   GERD (gastroesophageal reflux disease)   Altered mental status    AKI secondary to dehydration -Baseline creatinine 1.0.  Admission creatinine 1.92, improving with IV fluids   Lactic acidosis-resolved -Received IV fluids   Altered mental status- Improved -Improved with IV fluids.  CT head negative.  Ammonia normal   Hyperkalemia -Closely monitor   Chronic thrombocytopenia -Platelets dropping down this morning but I suspect this is delusional.  Will continue to monitor   Essential hypertension -Continue propranolol.  Lisinopril on hold.  IV as needed   Mixed hyperlipidemia -Continue Crestor   Acquired hypothyroidism -Continue Synthroid   Type 2 diabetes mellitus - Home metformin on hold.  Sliding scale and Accu-Cheks.   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  Continue propranolol   GERD -Continue Protonix   DVT prophylaxis: SCDs Start: 06/21/23 0327 Code Status: Full code Family Communication:  Spouse at bedside Status is: Inpatient Remains inpatient appropriate because: Continue  hospital stay for hydration and management of AKI    Subjective: Feels weak, but better.  Husband says she is coming around slowly.    Examination:  General exam: Appears calm and comfortable  Respiratory system: Clear to auscultation. Respiratory effort normal. Cardiovascular system: S1 & S2 heard, RRR. No JVD, murmurs, rubs, gallops or clicks. No pedal edema. Gastrointestinal system: Abdomen is nondistended, soft and nontender. No organomegaly or masses felt. Normal bowel sounds heard. Central nervous system: Alert and oriented. No focal neurological deficits. Extremities: Symmetric 5 x 5 power. Skin: No rashes, lesions or ulcers Psychiatry: Judgement and insight appear normal. Mood & affect appropriate.      Diet Orders (From admission, onward)     Start     Ordered   06/21/23 0328  Diet heart healthy/carb modified Room service appropriate? Yes; Fluid consistency: Thin  Diet effective now       Question Answer Comment  Diet-HS Snack? Nothing   Room service appropriate? Yes   Fluid consistency: Thin      06/21/23 0328            Objective: Vitals:   06/20/23 2245 06/20/23 2300 06/21/23 0112 06/21/23 0435  BP: 123/61 130/63 (!) 137/57 122/63  Pulse: (!) 58 (!) 59 (!) 58 (!) 54  Resp:  19 18 18   Temp:   98.3 F (36.8 C) 98.7 F (37.1 C)  TempSrc:   Oral   SpO2: 99% 98% 100% 98%  Weight:   94.3 kg   Height:   5\' 7"  (1.702 m)     Intake/Output Summary (Last 24 hours) at 06/21/2023 0825 Last data filed at  06/21/2023 0500 Gross per 24 hour  Intake 3293.94 ml  Output --  Net 3293.94 ml   Filed Weights   06/20/23 1357 06/21/23 0112  Weight: 96.2 kg 94.3 kg    Scheduled Meds:  [START ON 06/22/2023] influenza vac split trivalent PF  0.5 mL Intramuscular Tomorrow-1000   insulin aspart  0-9 Units Subcutaneous TID WC   [START ON 06/22/2023] pneumococcal 20-valent conjugate vaccine  0.5 mL Intramuscular Tomorrow-1000   Continuous Infusions:  sodium chloride  100 mL/hr at 06/21/23 0427    Nutritional status     Body mass index is 32.58 kg/m.  Data Reviewed:   CBC: Recent Labs  Lab 06/20/23 2022 06/21/23 0410  WBC 5.2 2.8*  NEUTROABS 3.1  --   HGB 11.2* 10.0*  HCT 34.9* 30.9*  MCV 93.1 93.1  PLT 88* 51*   Basic Metabolic Panel: Recent Labs  Lab 06/20/23 2022 06/21/23 0410  NA 133* 132*  K 5.4* 5.4*  CL 102 101  CO2 21* 22  GLUCOSE 97 75  BUN 42* 39*  CREATININE 1.92* 1.51*  CALCIUM 9.6 8.8*  MG  --  1.5*  PHOS  --  4.2   GFR: Estimated Creatinine Clearance: 47.3 mL/min (A) (by C-G formula based on SCr of 1.51 mg/dL (H)). Liver Function Tests: Recent Labs  Lab 06/20/23 2022 06/21/23 0410  AST 29 28  ALT 27 26  ALKPHOS 41 34*  BILITOT 0.4 0.3  PROT 7.6 6.2*  ALBUMIN 4.1 3.5   No results for input(s): "LIPASE", "AMYLASE" in the last 168 hours. Recent Labs  Lab 06/20/23 2234  AMMONIA <10   Coagulation Profile: No results for input(s): "INR", "PROTIME" in the last 168 hours. Cardiac Enzymes: No results for input(s): "CKTOTAL", "CKMB", "CKMBINDEX", "TROPONINI" in the last 168 hours. BNP (last 3 results) No results for input(s): "PROBNP" in the last 8760 hours. HbA1C: Recent Labs    06/20/23 2022  HGBA1C 5.8*   CBG: Recent Labs  Lab 06/21/23 0420 06/21/23 0759  GLUCAP 76 75   Lipid Profile: No results for input(s): "CHOL", "HDL", "LDLCALC", "TRIG", "CHOLHDL", "LDLDIRECT" in the last 72 hours. Thyroid Function Tests: No results for input(s): "TSH", "T4TOTAL", "FREET4", "T3FREE", "THYROIDAB" in the last 72 hours. Anemia Panel: No results for input(s): "VITAMINB12", "FOLATE", "FERRITIN", "TIBC", "IRON", "RETICCTPCT" in the last 72 hours. Sepsis Labs: Recent Labs  Lab 06/20/23 2043 06/20/23 2234  LATICACIDVEN 2.0* 1.4    Recent Results (from the past 240 hour(s))  Blood culture (routine x 2)     Status: None (Preliminary result)   Collection Time: 06/20/23  8:42 PM   Specimen: BLOOD RIGHT  ARM  Result Value Ref Range Status   Specimen Description   Final    BLOOD RIGHT ARM BOTTLES DRAWN AEROBIC AND ANAEROBIC   Special Requests BOTTLES DRAWN AEROBIC AND ANAEROBIC  Final   Culture   Final    NO GROWTH < 12 HOURS Performed at [facility], [address], [place], [place] [address]    Report Status PENDING  Incomplete  Blood culture (routine x 2)     Status: None (Preliminary result)   Collection Time: 06/20/23  8:43 PM   Specimen: BLOOD  Result Value Ref Range Status   Specimen Description BLOOD BLOOD RIGHT HAND  Final   Special Requests   Final    BOTTLES DRAWN AEROBIC AND ANAEROBIC Blood Culture adequate volume   Culture   Final    NO GROWTH < 12 HOURS Performed at [facility], [address], [place], [place] [address]    Report Status PENDING  Incomplete         Radiology Studies: CT Head Wo Contrast  Result Date: 06/20/2023 CLINICAL DATA:  Altered level of consciousness, productive cough, dizziness EXAM: CT HEAD WITHOUT CONTRAST TECHNIQUE: Contiguous axial images were obtained from the base of the skull through the vertex without intravenous contrast. RADIATION DOSE REDUCTION: This exam was performed according to the departmental dose-optimization program which includes automated exposure control, adjustment of the mA and/or kV according to patient size and/or use of iterative reconstruction technique. COMPARISON:  11/14/2014 FINDINGS: Brain: No acute infarct or hemorrhage. Lateral ventricles and midline structures are unremarkable. No acute extra-axial fluid collections. No mass effect. Vascular: No hyperdense vessel or unexpected calcification. Skull: Normal. Negative for fracture or focal lesion. Sinuses/Orbits: No acute finding. Other: None. IMPRESSION: 1. No acute intracranial process. Electronically Signed   By: Sharlet Salina M.D.   On: 06/20/2023 22:30   DG Chest 2 View  Result Date: 06/20/2023 CLINICAL DATA:  cough, + covid 9/7 EXAM: CHEST - 2 VIEW  COMPARISON:  August 14, 2021 FINDINGS: The cardiomediastinal silhouette is unchanged in contour.Severe atherosclerotic calcifications. No pleural effusion. No pneumothorax. No acute pleuroparenchymal abnormality. Visualized abdomen is unremarkable. Mild multilevel degenerative changes of the thoracic spine. IMPRESSION: No acute cardiopulmonary abnormality. Electronically Signed   By: Meda Klinefelter M.D.   On: 06/20/2023 14:56           LOS: 1 day   Time spent= 35 mins    Miguel Rota, MD [facility]  If 7PM-7AM, please contact night-coverage  06/21/2023, 8:25 AM

## 2023-06-21 NOTE — Plan of Care (Signed)
  Problem: Education: Goal: Knowledge of General Education information will improve Description: Including pain rating scale, medication(s)/side effects and non-pharmacologic comfort measures Outcome: Not Progressing   Problem: Health Behavior/Discharge Planning: Goal: Ability to manage health-related needs will improve Outcome: Not Progressing   

## 2023-06-21 NOTE — Progress Notes (Signed)
Initial Nutrition Assessment  DOCUMENTATION CODES:   Not applicable  INTERVENTION:   Glucerna Shake po BID, each supplement provides 220 kcal and 10 grams of protein MVI with minerals daily  NUTRITION DIAGNOSIS:   Increased nutrient needs related to acute illness as evidenced by estimated needs.  GOAL:   Patient will meet greater than or equal to 90% of their needs  MONITOR:   PO intake, Supplement acceptance  REASON FOR ASSESSMENT:   Malnutrition Screening Tool    ASSESSMENT:   57 yo female admitted with AKI. PMH includes DM-2, HTN, HLD, GERD, hyponatremia, NASH cirrhosis, recent COVID-19 infection 1 week PTA.  Patient with poor intake d/t loss of taste and smell since COVID diagnosis 1 week PTA. Currently on a heart healthy carb modified diet with intakes documented at 100% of breakfast today.    On admission nutrition screen, patient reported 2-13 lb weight loss and poor intake r/t decreased appetite. Weight encounters reviewed. Patient with 9% weight loss within the past 6 months. This is not significant weight loss for the time frame. Weight stable over the past 2 months.   Labs reviewed. Na 132, K 5.4, BUN 39, creat 1.51, mag 1.5, A1C 5.8 CBG: 76-75-86  Medications reviewed and include Novolog SSI.  NUTRITION - FOCUSED PHYSICAL EXAM:  Unable to complete, patient receiving nursing care  Diet Order:   Diet Order             Diet heart healthy/carb modified Room service appropriate? Yes; Fluid consistency: Thin  Diet effective now                   EDUCATION NEEDS:   Not appropriate for education at this time  Skin:  Skin Assessment: Reviewed RN Assessment  Last BM:  9/18  Height:   Ht Readings from Last 1 Encounters:  06/21/23 5\' 7"  (1.702 m)    Weight:   Wt Readings from Last 1 Encounters:  06/21/23 94.3 kg    Ideal Body Weight:  61.4 kg  BMI:  Body mass index is 32.58 kg/m.  Estimated Nutritional Needs:   Kcal:   1800-2000  Protein:  90-110 gm  Fluid:  1.8-2 L   Gabriel Rainwater RD, LDN, CNSC Please refer to Amion for contact information.

## 2023-06-21 NOTE — Progress Notes (Signed)
   06/21/23 1003  TOC Brief Assessment  Insurance and Status Reviewed  Patient has primary care physician Yes  Home environment has been reviewed from home  Prior level of function: independent  Prior/Current Home Services No current home services  Social Determinants of Health Reivew SDOH reviewed no interventions necessary  Readmission risk has been reviewed Yes  Transition of care needs no transition of care needs at this time    Transition of Care Department [facility]) has reviewed patient and no TOC needs have been identified at this time. We will continue to monitor patient advancement through interdisciplinary progression rounds. If new patient transition needs arise, please place a TOC consult.

## 2023-06-21 NOTE — Progress Notes (Signed)
Daughter JESSICA CALLED FOR UPDATE AND VERIFIED ALLERGIES [phone] IS BEST CONTACT NUMBER

## 2023-06-21 NOTE — Care Management Important Message (Signed)
Important Message  Patient Details  Name: Priscilla Roberts MRN: [id number] Date of Birth: 05/11/1966   Medicare Important Message Given:  N/A - LOS <3 / Initial given by admissions     Corey Harold 06/21/2023, 10:15 AM

## 2023-06-22 DIAGNOSIS — E669 Obesity, unspecified: Secondary | ICD-10-CM | POA: Insufficient documentation

## 2023-06-22 DIAGNOSIS — N179 Acute kidney failure, unspecified: Secondary | ICD-10-CM | POA: Diagnosis not present

## 2023-06-22 DIAGNOSIS — D693 Immune thrombocytopenic purpura: Secondary | ICD-10-CM | POA: Insufficient documentation

## 2023-06-22 LAB — PHOSPHORUS: Phosphorus: 4.3 mg/dL (ref 2.5–4.6)

## 2023-06-22 LAB — MAGNESIUM: Magnesium: 1.6 mg/dL — ABNORMAL LOW (ref 1.7–2.4)

## 2023-06-22 LAB — BASIC METABOLIC PANEL
Anion gap: 11 (ref 5–15)
BUN: 30 mg/dL — ABNORMAL HIGH (ref 6–20)
CO2: 23 mmol/L (ref 22–32)
Calcium: 9 mg/dL (ref 8.9–10.3)
Chloride: 101 mmol/L (ref 98–111)
Creatinine, Ser: 1.22 mg/dL — ABNORMAL HIGH (ref 0.44–1.00)
GFR, Estimated: 51 mL/min — ABNORMAL LOW (ref >60)
Glucose, Bld: 90 mg/dL (ref 70–99)
Potassium: 5 mmol/L (ref 3.5–5.1)
Sodium: 135 mmol/L (ref 135–145)

## 2023-06-22 LAB — GLUCOSE, CAPILLARY: Glucose-Capillary: 129 mg/dL — ABNORMAL HIGH (ref 70–99)

## 2023-06-22 MED ORDER — ENSURE ENLIVE PO LIQD
237.0000 mL | Freq: Two times a day (BID) | ORAL | Status: DC
  Administered 2023-06-22: 237 mL via ORAL

## 2023-06-22 MED ORDER — MAGNESIUM SULFATE 2 GM/50ML IV SOLN
2.0000 g | Freq: Once | INTRAVENOUS | Status: AC
  Administered 2023-06-22: 2 g via INTRAVENOUS
  Filled 2023-06-22: qty 50

## 2023-06-22 MED ORDER — HYDROCOD POLI-CHLORPHE POLI ER 10-8 MG/5ML PO SUER: 5.0000 mL | Freq: Two times a day (BID) | ORAL | 0 refills | Status: AC | PRN

## 2023-06-22 NOTE — Care Management Important Message (Signed)
Important Message  Patient Details  Name: BERTILLA SHATTLES MRN: [id number] Date of Birth: 1966/05/13   Medicare Important Message Given:  N/A - LOS <3 / Initial given by admissions     Corey Harold 06/22/2023, 10:32 AM

## 2023-06-22 NOTE — Hospital Course (Signed)
Mrs. Priscilla Roberts is a 57 y.o. F with DM, HTN, obesity, and NASH cirrhosis who presented for productive cough, lightheadedness in the setting of 1 week COVID and poor PO intake.  In the ER, found to have Cr 1.9 from baseline ~1.  Admitted on fluids.

## 2023-06-22 NOTE — Discharge Summary (Signed)
Physician Discharge Summary   Patient: Priscilla Roberts MRN: [id number] DOB: 1966/04/05  Admit date:     06/20/2023  Discharge date: 06/22/23  Discharge Physician: Alberteen Sam   PCP: Toma Deiters, MD     Recommendations at discharge:  Follow up with PCP Dr. Olena Leatherwood in 1 week for AKI in the setting of COVID and ACEi Dr. Olena Leatherwood: Please check BMP in 1 week Please resume antihypertensives as appropriate     Discharge Diagnoses: Principal Problem:   AKI (acute kidney injury) (HCC) Active Problems:   Mixed hyperlipidemia   Essential hypertension   Diabetes mellitus (HCC)   Acquired hypothyroidism   Liver cirrhosis secondary to NASH (HCC)   History of esophageal varices   Dehydration   Hyperkalemia   GERD (gastroesophageal reflux disease)   Altered mental status   Encephalopathy ruled out   Chronic thrombocytopenia   Obesity (BMI 30-39.9)     Hospital Course: Mrs. Priscilla Roberts is a 57 y.o. F with DM, HTN, obesity, and NASH cirrhosis who presented for productive cough, lightheadedness in the setting of 1 week COVID and poor PO intake.  In the ER, found to have Cr 1.9 from baseline ~1.  Admitted on fluids.    COVID Patient was admitted on IV fluids.  CXR clear, on room air.  Had already completed Paxlovid and developed dysgeusia.  Treated supportively, anticipatory guidance given.    AKI Cr improved to 1.2, and she was taking PO well.     Discharged to follow up with PCP in 1 week, holding ACEi and spironolactone.               The [facility] Controlled Substances Registry was reviewed for this patient prior to discharge.  Consultants: None Procedures performed: None  Disposition: Home   DISCHARGE MEDICATION: Allergies as of 06/22/2023       Reactions   Prednisone Other (See Comments)   Mental Status Changes (intolerance), Patient cannot recall this reaction, but recalls feet hurting afterwards   Tizanidine Other (See Comments)    Mental Status Changes (intolerance),Cannot recall this reaction   Chocolate Flavor Other (See Comments)   Migraine headache   Lorazepam Other (See Comments)   Feels like "bugs are crawling all over me." Emotional and crying, stayed in bed.   Other Rash   Plastic tape Prefers paper tape   Tramadol Other (See Comments)   "feels like bugs crawling on me"        Medication List     STOP taking these medications    lisinopril 2.5 MG tablet Commonly known as: ZESTRIL   Paxlovid (300/100) 20 x 150 MG & 10 x 100MG  Tbpk Generic drug: nirmatrelvir & ritonavir   spironolactone 25 MG tablet Commonly known as: ALDACTONE       TAKE these medications    Accu-Chek Guide test strip Generic drug: glucose blood   chlorpheniramine-HYDROcodone 10-8 MG/5ML Commonly known as: TUSSIONEX Take 5 mLs by mouth every 12 (twelve) hours as needed for cough.   cimetidine 400 MG tablet Commonly known as: TAGAMET Take 400 mg by mouth 3 (three) times daily as needed.   cyanocobalamin 1000 MCG tablet Take 1,000 mcg by mouth daily.   ferrous sulfate 325 (65 FE) MG tablet Take 325 mg by mouth at bedtime.   Fish Oil 1000 MG Caps Take 1,000 mg by mouth in the morning, at noon, and at bedtime.   folic acid 1 MG tablet Commonly known as: FOLVITE Take 1 mg by  mouth daily.   Garlic 1000 MG Caps Take 1,000 mg by mouth daily.   hydrOXYzine 25 MG capsule Commonly known as: VISTARIL Take 25-50 mg by mouth at bedtime as needed.   levothyroxine 100 MCG tablet Commonly known as: SYNTHROID Take 100 mcg by mouth daily before breakfast.   metFORMIN 1000 MG tablet Commonly known as: GLUCOPHAGE Take 1,000 mg by mouth 2 (two) times daily with a meal.   pantoprazole 40 MG tablet Commonly known as: PROTONIX Take 40 mg by mouth 2 (two) times daily.   PARoxetine 10 MG tablet Commonly known as: PAXIL Take 10 mg by mouth daily.   pregabalin 300 MG capsule Commonly known as: LYRICA Take 300 mg by  mouth 2 (two) times daily.   promethazine-dextromethorphan 6.25-15 MG/5ML syrup Commonly known as: PROMETHAZINE-DM Take by mouth.   propranolol 40 MG tablet Commonly known as: INDERAL Take 40 mg by mouth 2 (two) times daily.   topiramate 50 MG tablet Commonly known as: TOPAMAX Take 50 mg by mouth 2 (two) times daily.        Follow-up Information     Toma Deiters, MD. Schedule an appointment as soon as possible for a visit in 1 week(s).   Specialty: Internal Medicine Contact information: [address] [place] [address] [phone]                 Discharge Instructions     Discharge instructions   Complete by: As directed    **IMPORTANT DISCHARGE INSTRUCTIONS**   From Dr. Maryfrances Bunnell: You were admitted for dehydration and kidney injury from COVID  You were treated with IV fluids and your kidneys got better.  For now, HOLD (do not take) your lisinopril and spironolactone  Go see your primary doctor in 1 week and have him check your blood pressure and resume those medicines if appropriate  For your cough: Use the incentive spirometer and flutter valve to clear the lungs  To suppress cough, take Tussionex This is a narcotic cough medicine, so do not drive while taking, do not mix with other sedating medicines, and dispose of excess  For mucus, take over the counter Mucinex This contains 'guaifenesin'  Resume your other home medicines Drink plenty of fluids   Increase activity slowly   Complete by: As directed        Discharge Exam: Filed Weights   06/20/23 1357 06/21/23 0112  Weight: 96.2 kg 94.3 kg    General: Pt is alert, awake, not in acute distress Cardiovascular: RRR, nl S1-S2, no murmurs appreciated.   No LE edema.   Respiratory: Normal respiratory rate and rhythm.  CTAB without rales or wheezes. Abdominal: Abdomen soft and non-tender.  No distension or HSM.   Neuro/Psych: Strength symmetric in upper and lower extremities.   Judgment and insight appear normal.   Condition at discharge: good  The results of significant diagnostics from this hospitalization (including imaging, microbiology, ancillary and laboratory) are listed below for reference.   Imaging Studies: CT Head Wo Contrast  Result Date: 06/20/2023 CLINICAL DATA:  Altered level of consciousness, productive cough, dizziness EXAM: CT HEAD WITHOUT CONTRAST TECHNIQUE: Contiguous axial images were obtained from the base of the skull through the vertex without intravenous contrast. RADIATION DOSE REDUCTION: This exam was performed according to the departmental dose-optimization program which includes automated exposure control, adjustment of the mA and/or kV according to patient size and/or use of iterative reconstruction technique. COMPARISON:  11/14/2014 FINDINGS: Brain: No acute infarct or hemorrhage.  Lateral ventricles and midline structures are unremarkable. No acute extra-axial fluid collections. No mass effect. Vascular: No hyperdense vessel or unexpected calcification. Skull: Normal. Negative for fracture or focal lesion. Sinuses/Orbits: No acute finding. Other: None. IMPRESSION: 1. No acute intracranial process. Electronically Signed   By: Sharlet Salina M.D.   On: 06/20/2023 22:30   DG Chest 2 View  Result Date: 06/20/2023 CLINICAL DATA:  cough, + covid 9/7 EXAM: CHEST - 2 VIEW COMPARISON:  August 14, 2021 FINDINGS: The cardiomediastinal silhouette is unchanged in contour.Severe atherosclerotic calcifications. No pleural effusion. No pneumothorax. No acute pleuroparenchymal abnormality. Visualized abdomen is unremarkable. Mild multilevel degenerative changes of the thoracic spine. IMPRESSION: No acute cardiopulmonary abnormality. Electronically Signed   By: Meda Klinefelter M.D.   On: 06/20/2023 14:56    Microbiology: Results for orders placed or performed during the hospital encounter of 06/20/23  Blood culture (routine x 2)     Status: None  (Preliminary result)   Collection Time: 06/20/23  8:42 PM   Specimen: BLOOD RIGHT ARM  Result Value Ref Range Status   Specimen Description   Final    BLOOD RIGHT ARM BOTTLES DRAWN AEROBIC AND ANAEROBIC   Special Requests BOTTLES DRAWN AEROBIC AND ANAEROBIC  Final   Culture   Final    NO GROWTH 2 DAYS Performed at [facility], [address] [place], [place] [address]    Report Status PENDING  Incomplete  Blood culture (routine x 2)     Status: None (Preliminary result)   Collection Time: 06/20/23  8:43 PM   Specimen: BLOOD  Result Value Ref Range Status   Specimen Description BLOOD BLOOD RIGHT HAND  Final   Special Requests   Final    BOTTLES DRAWN AEROBIC AND ANAEROBIC Blood Culture adequate volume   Culture   Final    NO GROWTH 2 DAYS Performed at [facility], [address], [place], [place] [address]    Report Status PENDING  Incomplete    Labs: CBC: Recent Labs  Lab 06/20/23 2022 06/21/23 0410  WBC 5.2 2.8*  NEUTROABS 3.1  --   HGB 11.2* 10.0*  HCT 34.9* 30.9*  MCV 93.1 93.1  PLT 88* 51*   Basic Metabolic Panel: Recent Labs  Lab 06/20/23 2022 06/21/23 0410 06/22/23 0411  NA 133* 132* 135  K 5.4* 5.4* 5.0  CL 102 101 101  CO2 21* 22 23  GLUCOSE 97 75 90  BUN 42* 39* 30*  CREATININE 1.92* 1.51* 1.22*  CALCIUM 9.6 8.8* 9.0  MG  --  1.5* 1.6*  PHOS  --  4.2 4.3   Liver Function Tests: Recent Labs  Lab 06/20/23 2022 06/21/23 0410  AST 29 28  ALT 27 26  ALKPHOS 41 34*  BILITOT 0.4 0.3  PROT 7.6 6.2*  ALBUMIN 4.1 3.5   CBG: Recent Labs  Lab 06/21/23 0759 06/21/23 1125 06/21/23 1716 06/21/23 2123 06/22/23 0808  GLUCAP 75 86 122* 172* 129*    Discharge time spent: approximately 35 minutes spent on discharge counseling, evaluation of patient on day of discharge, and coordination of discharge planning with nursing, social work, pharmacy and case management  Signed: Alberteen Sam, MD [facility] 06/22/2023

## 2023-06-25 LAB — CULTURE, BLOOD (ROUTINE X 2)
Culture: NO GROWTH
Culture: NO GROWTH
Special Requests: ADEQUATE

## 2023-06-27 DIAGNOSIS — U071 COVID-19: Secondary | ICD-10-CM | POA: Diagnosis not present

## 2023-06-27 DIAGNOSIS — I10 Essential (primary) hypertension: Secondary | ICD-10-CM | POA: Diagnosis not present

## 2023-06-27 DIAGNOSIS — Z6833 Body mass index (BMI) 33.0-33.9, adult: Secondary | ICD-10-CM | POA: Diagnosis not present

## 2023-06-27 DIAGNOSIS — M5431 Sciatica, right side: Secondary | ICD-10-CM | POA: Diagnosis not present

## 2023-08-07 ENCOUNTER — Emergency Department (HOSPITAL_COMMUNITY): Payer: Medicare HMO

## 2023-08-07 ENCOUNTER — Other Ambulatory Visit: Payer: Self-pay

## 2023-08-07 ENCOUNTER — Encounter (HOSPITAL_COMMUNITY): Payer: Self-pay | Admitting: *Deleted

## 2023-08-07 ENCOUNTER — Emergency Department (HOSPITAL_COMMUNITY)
Admission: EM | Admit: 2023-08-07 | Discharge: 2023-08-07 | Disposition: A | Discharge status: Home / Self Care | Payer: Medicare HMO | Attending: Emergency Medicine | Admitting: Emergency Medicine

## 2023-08-07 DIAGNOSIS — Z7984 Long term (current) use of oral hypoglycemic drugs: Secondary | ICD-10-CM | POA: Insufficient documentation

## 2023-08-07 DIAGNOSIS — W01198A Fall on same level from slipping, tripping and stumbling with subsequent striking against other object, initial encounter: Secondary | ICD-10-CM | POA: Insufficient documentation

## 2023-08-07 DIAGNOSIS — S0990XA Unspecified injury of head, initial encounter: Secondary | ICD-10-CM | POA: Insufficient documentation

## 2023-08-07 DIAGNOSIS — S335XXA Sprain of ligaments of lumbar spine, initial encounter: Secondary | ICD-10-CM | POA: Insufficient documentation

## 2023-08-07 DIAGNOSIS — S93401A Sprain of unspecified ligament of right ankle, initial encounter: Secondary | ICD-10-CM | POA: Diagnosis not present

## 2023-08-07 DIAGNOSIS — M25571 Pain in right ankle and joints of right foot: Secondary | ICD-10-CM | POA: Diagnosis not present

## 2023-08-07 DIAGNOSIS — M542 Cervicalgia: Secondary | ICD-10-CM | POA: Diagnosis not present

## 2023-08-07 DIAGNOSIS — Z79899 Other long term (current) drug therapy: Secondary | ICD-10-CM | POA: Diagnosis not present

## 2023-08-07 DIAGNOSIS — Z471 Aftercare following joint replacement surgery: Secondary | ICD-10-CM | POA: Diagnosis not present

## 2023-08-07 DIAGNOSIS — M19071 Primary osteoarthritis, right ankle and foot: Secondary | ICD-10-CM | POA: Diagnosis not present

## 2023-08-07 DIAGNOSIS — S90511A Abrasion, right ankle, initial encounter: Secondary | ICD-10-CM | POA: Insufficient documentation

## 2023-08-07 DIAGNOSIS — I672 Cerebral atherosclerosis: Secondary | ICD-10-CM | POA: Diagnosis not present

## 2023-08-07 DIAGNOSIS — M47816 Spondylosis without myelopathy or radiculopathy, lumbar region: Secondary | ICD-10-CM | POA: Diagnosis not present

## 2023-08-07 DIAGNOSIS — S3992XA Unspecified injury of lower back, initial encounter: Secondary | ICD-10-CM | POA: Diagnosis present

## 2023-08-07 DIAGNOSIS — Y92019 Unspecified place in single-family (private) house as the place of occurrence of the external cause: Secondary | ICD-10-CM | POA: Insufficient documentation

## 2023-08-07 DIAGNOSIS — S32030A Wedge compression fracture of third lumbar vertebra, initial encounter for closed fracture: Secondary | ICD-10-CM | POA: Diagnosis not present

## 2023-08-07 DIAGNOSIS — W19XXXA Unspecified fall, initial encounter: Secondary | ICD-10-CM

## 2023-08-07 DIAGNOSIS — M47817 Spondylosis without myelopathy or radiculopathy, lumbosacral region: Secondary | ICD-10-CM | POA: Diagnosis not present

## 2023-08-07 MED ORDER — OXYCODONE-ACETAMINOPHEN 5-325 MG PO TABS
1.0000 | ORAL_TABLET | Freq: Once | ORAL | Status: AC
  Administered 2023-08-07: 1 via ORAL
  Filled 2023-08-07: qty 1

## 2023-08-07 MED ORDER — METHOCARBAMOL 500 MG PO TABS
500.0000 mg | ORAL_TABLET | Freq: Once | ORAL | Status: AC
  Administered 2023-08-07: 500 mg via ORAL
  Filled 2023-08-07: qty 1

## 2023-08-07 MED ORDER — METHOCARBAMOL 500 MG PO TABS
500.0000 mg | ORAL_TABLET | Freq: Three times a day (TID) | ORAL | 0 refills | Status: AC
Start: 2023-08-07 — End: ?

## 2023-08-07 MED ORDER — OXYCODONE-ACETAMINOPHEN 5-325 MG PO TABS
1.0000 | ORAL_TABLET | ORAL | 0 refills | Status: AC | PRN
Start: 2023-08-07 — End: ?

## 2023-08-07 NOTE — Discharge Instructions (Signed)
Avoid bending or twisting movements for at least 7 days.  No heavy lifting.  You may alternate ice and heat to your lower back.  Please follow-up with your primary care provider for recheck, return to emergency department for any new or worsening symptoms.

## 2023-08-07 NOTE — ED Triage Notes (Signed)
Pt fell this morning  at home after her dog pulled her down.  Pt states she hit her head on metal frame of a carport.  Denies any blood thinners or LOC.  Pt c/o whole back pain making ambulating painful.

## 2023-08-10 NOTE — ED Provider Notes (Signed)
[place] EMERGENCY DEPARTMENT AT [facility] Provider Note   CSN: [id number] Arrival date & time: 08/07/23  1459     History  Chief Complaint  Patient presents with   Priscilla Roberts    Priscilla Roberts is a 57 y.o. female.   Fall Associated symptoms include headaches. Pertinent negatives include no chest pain and no shortness of breath.       Priscilla Roberts is a 57 y.o. female who presents to the Emergency Department complaining of headache and back pain.  She was walking her dog and the dog pulled her down causing her to strike her head on a metal pole.  She denies LOC and blood thinner. She has throbbing headache.  Also having aching pains to her back and right ankle pain  Denies visual changes, vomiting, lethargy, and dizziness.   Home Medications Prior to Admission medications   Medication Sig Start Date End Date Taking? Authorizing Provider  methocarbamol (ROBAXIN) 500 MG tablet Take 1 tablet (500 mg total) by mouth 3 (three) times daily. 08/07/23  Yes Ernisha Sorn, PA-C  oxyCODONE-acetaminophen (PERCOCET/ROXICET) 5-325 MG tablet Take 1 tablet by mouth every 4 (four) hours as needed. 08/07/23  Yes Wiatt Mahabir, PA-C  ACCU-CHEK GUIDE test strip  09/30/21   [provider]  chlorpheniramine-HYDROcodone (TUSSIONEX) 10-8 MG/5ML Take 5 mLs by mouth every 12 (twelve) hours as needed for cough. 06/22/23   Danford, Earl Lites, MD  cimetidine (TAGAMET) 400 MG tablet Take 400 mg by mouth 3 (three) times daily as needed. 05/24/23   [provider]  cyanocobalamin 1000 MCG tablet Take 1,000 mcg by mouth daily.    [provider]  ferrous sulfate 325 (65 FE) MG tablet Take 325 mg by mouth at bedtime. 01/08/20   [provider]  folic acid (FOLVITE) 1 MG tablet Take 1 mg by mouth daily.    [provider]  Garlic 1000 MG CAPS Take 1,000 mg by mouth daily.    [provider]  hydrOXYzine (VISTARIL) 25 MG capsule Take 25-50 mg  by mouth at bedtime as needed. 05/23/23   [provider]  levothyroxine (SYNTHROID) 100 MCG tablet Take 100 mcg by mouth daily before breakfast.    [provider]  metFORMIN (GLUCOPHAGE) 1000 MG tablet Take 1,000 mg by mouth 2 (two) times daily with a meal.    [provider]  Omega-3 Fatty Acids (FISH OIL) 1000 MG CAPS Take 1,000 mg by mouth in the morning, at noon, and at bedtime.    [provider]  pantoprazole (PROTONIX) 40 MG tablet Take 40 mg by mouth 2 (two) times daily. 09/04/19   [provider]  PARoxetine (PAXIL) 10 MG tablet Take 10 mg by mouth daily. 09/04/19   [provider]  pregabalin (LYRICA) 300 MG capsule Take 300 mg by mouth 2 (two) times daily. 03/16/20   [provider]  promethazine-dextromethorphan (PROMETHAZINE-DM) 6.25-15 MG/5ML syrup Take by mouth. 06/14/23   [provider]  propranolol (INDERAL) 40 MG tablet Take 40 mg by mouth 2 (two) times daily. 12/02/18   [provider]  topiramate (TOPAMAX) 50 MG tablet Take 50 mg by mouth 2 (two) times daily.    [provider]      Allergies    Prednisone, Tizanidine, Chocolate flavor, Lorazepam, Other, and Tramadol    Review of Systems   Review of Systems  Constitutional:  Negative for appetite change and fever.  Eyes:  Negative for visual disturbance.  Respiratory:  Negative for shortness of breath.   Cardiovascular:  Negative for chest pain.  Gastrointestinal:  Negative for nausea and vomiting.  Musculoskeletal:  Positive for back pain. Negative for gait problem and neck pain.  Skin:  Negative for wound.  Neurological:  Positive for headaches. Negative for dizziness, syncope, weakness and numbness.  Psychiatric/Behavioral:  Negative for confusion.     Physical Exam Updated Vital Signs BP 126/74 (BP Location: Right Arm)   Pulse 72   Temp 97.8 F (36.6 C) (Oral)   Resp 17   Ht 5\' 7"  (1.702 m)   Wt 99.8 kg   SpO2 99%   BMI  34.46 kg/m  Physical Exam Vitals and nursing note reviewed.  Constitutional:      General: She is not in acute distress.    Appearance: Normal appearance. She is not ill-appearing or toxic-appearing.  HENT:     Head:     Comments: Tenderness parietal scalp.  No abrasion open wound or hematoma Cardiovascular:     Rate and Rhythm: Normal rate and regular rhythm.     Pulses: Normal pulses.  Pulmonary:     Effort: Pulmonary effort is normal.     Breath sounds: Normal breath sounds.  Musculoskeletal:        General: Tenderness present. Normal range of motion.     Cervical back: Normal range of motion.     Comments: Tenderness with ROM right ankle.  No edema or bony deformity  Skin:    General: Skin is warm.     Capillary Refill: Capillary refill takes less than 2 seconds.  Neurological:     General: No focal deficit present.     Mental Status: She is alert.     Sensory: No sensory deficit.     Motor: No weakness.     ED Results / Procedures / Treatments   Labs (all labs ordered are listed, but only abnormal results are displayed) Labs Reviewed - No data to display  EKG None  Radiology No results found.  Procedures Procedures    Medications Ordered in ED Medications  oxyCODONE-acetaminophen (PERCOCET/ROXICET) 5-325 MG per tablet 1 tablet (1 tablet Oral Given 08/07/23 1838)  methocarbamol (ROBAXIN) tablet 500 mg (500 mg Oral Given 08/07/23 1838)    ED Course/ Medical Decision Making/ A&P                                 Medical Decision Making Pt with complaint of head injury and back pain after mechanical fall.  Stuck head on a metal pole.  No LOC, dizziness, vomiting or  visual change.  Ambulatory.  Also having back pain since the fall.  No hematoma of the scalp on exam.  Mentating well.  Fx, concussion, strain, skull fx, subdural hematoma considered.   Amount and/or Complexity of Data Reviewed Radiology: ordered.    Details: CT head, C spine, L spine all w/o  acute finding  Right ankle also negative  Discussion of management or test interpretation with external provider(s): Work up reassuring.  Suspect minor head injury and MSK of her back.  She was given head injury instructions and agrees to close out pt f/u with PCP in a few days.  Short course pain medication given and muscle relaxer.  Database reviewed.   Risk Prescription drug management.           Final Clinical Impression(s) / ED Diagnoses Final diagnoses:  Fall,  initial encounter  Minor head injury, initial encounter  Lumbar sprain, initial encounter  Abrasion of right ankle, initial encounter    Rx / DC Orders ED Discharge Orders          Ordered    methocarbamol (ROBAXIN) 500 MG tablet  3 times daily        08/07/23 2155    oxyCODONE-acetaminophen (PERCOCET/ROXICET) 5-325 MG tablet  Every 4 hours PRN        08/07/23 2155              Pauline Aus, PA-C 08/10/23 2329    Gloris Manchester, MD 08/12/23 [phone]

## 2023-08-16 DIAGNOSIS — I10 Essential (primary) hypertension: Secondary | ICD-10-CM | POA: Diagnosis not present

## 2023-08-16 DIAGNOSIS — R809 Proteinuria, unspecified: Secondary | ICD-10-CM | POA: Diagnosis not present

## 2023-08-16 DIAGNOSIS — K7469 Other cirrhosis of liver: Secondary | ICD-10-CM | POA: Diagnosis not present

## 2023-08-16 DIAGNOSIS — N1831 Chronic kidney disease, stage 3a: Secondary | ICD-10-CM | POA: Diagnosis not present

## 2023-08-16 DIAGNOSIS — K219 Gastro-esophageal reflux disease without esophagitis: Secondary | ICD-10-CM | POA: Diagnosis not present

## 2023-08-16 DIAGNOSIS — E1143 Type 2 diabetes mellitus with diabetic autonomic (poly)neuropathy: Secondary | ICD-10-CM | POA: Diagnosis not present

## 2023-08-16 DIAGNOSIS — E038 Other specified hypothyroidism: Secondary | ICD-10-CM | POA: Diagnosis not present

## 2023-08-16 DIAGNOSIS — E1122 Type 2 diabetes mellitus with diabetic chronic kidney disease: Secondary | ICD-10-CM | POA: Diagnosis not present

## 2023-08-16 DIAGNOSIS — M1732 Unilateral post-traumatic osteoarthritis, left knee: Secondary | ICD-10-CM | POA: Diagnosis not present

## 2023-08-16 DIAGNOSIS — F331 Major depressive disorder, recurrent, moderate: Secondary | ICD-10-CM | POA: Diagnosis not present

## 2023-09-11 DIAGNOSIS — I8511 Secondary esophageal varices with bleeding: Secondary | ICD-10-CM | POA: Diagnosis not present

## 2023-09-11 DIAGNOSIS — E875 Hyperkalemia: Secondary | ICD-10-CM | POA: Diagnosis not present

## 2023-09-11 DIAGNOSIS — Z885 Allergy status to narcotic agent status: Secondary | ICD-10-CM | POA: Diagnosis not present

## 2023-09-11 DIAGNOSIS — D731 Hypersplenism: Secondary | ICD-10-CM | POA: Diagnosis not present

## 2023-09-11 DIAGNOSIS — Z7984 Long term (current) use of oral hypoglycemic drugs: Secondary | ICD-10-CM | POA: Diagnosis not present

## 2023-09-11 DIAGNOSIS — N1832 Chronic kidney disease, stage 3b: Secondary | ICD-10-CM | POA: Diagnosis not present

## 2023-09-11 DIAGNOSIS — D500 Iron deficiency anemia secondary to blood loss (chronic): Secondary | ICD-10-CM | POA: Diagnosis not present

## 2023-09-11 DIAGNOSIS — E785 Hyperlipidemia, unspecified: Secondary | ICD-10-CM | POA: Diagnosis not present

## 2023-09-11 DIAGNOSIS — K7460 Unspecified cirrhosis of liver: Secondary | ICD-10-CM | POA: Diagnosis not present

## 2023-09-11 DIAGNOSIS — E1122 Type 2 diabetes mellitus with diabetic chronic kidney disease: Secondary | ICD-10-CM | POA: Diagnosis not present

## 2023-09-11 DIAGNOSIS — J449 Chronic obstructive pulmonary disease, unspecified: Secondary | ICD-10-CM | POA: Diagnosis not present

## 2023-09-11 DIAGNOSIS — I129 Hypertensive chronic kidney disease with stage 1 through stage 4 chronic kidney disease, or unspecified chronic kidney disease: Secondary | ICD-10-CM | POA: Diagnosis not present

## 2023-09-11 DIAGNOSIS — Z87891 Personal history of nicotine dependence: Secondary | ICD-10-CM | POA: Diagnosis not present

## 2023-09-12 DIAGNOSIS — E875 Hyperkalemia: Secondary | ICD-10-CM | POA: Diagnosis not present

## 2023-09-12 DIAGNOSIS — Z6834 Body mass index (BMI) 34.0-34.9, adult: Secondary | ICD-10-CM | POA: Diagnosis not present

## 2023-09-12 DIAGNOSIS — I10 Essential (primary) hypertension: Secondary | ICD-10-CM | POA: Diagnosis not present

## 2023-09-18 DIAGNOSIS — D731 Hypersplenism: Secondary | ICD-10-CM | POA: Diagnosis not present

## 2023-09-18 DIAGNOSIS — D500 Iron deficiency anemia secondary to blood loss (chronic): Secondary | ICD-10-CM | POA: Diagnosis not present

## 2023-09-18 DIAGNOSIS — K7460 Unspecified cirrhosis of liver: Secondary | ICD-10-CM | POA: Diagnosis not present

## 2023-09-18 DIAGNOSIS — I8511 Secondary esophageal varices with bleeding: Secondary | ICD-10-CM | POA: Diagnosis not present

## 2023-09-18 DIAGNOSIS — J449 Chronic obstructive pulmonary disease, unspecified: Secondary | ICD-10-CM | POA: Diagnosis not present

## 2023-09-18 DIAGNOSIS — Z794 Long term (current) use of insulin: Secondary | ICD-10-CM | POA: Diagnosis not present

## 2023-09-18 DIAGNOSIS — E1142 Type 2 diabetes mellitus with diabetic polyneuropathy: Secondary | ICD-10-CM | POA: Diagnosis not present

## 2023-10-17 DIAGNOSIS — D500 Iron deficiency anemia secondary to blood loss (chronic): Secondary | ICD-10-CM | POA: Diagnosis not present

## 2023-10-24 DIAGNOSIS — D500 Iron deficiency anemia secondary to blood loss (chronic): Secondary | ICD-10-CM | POA: Diagnosis not present

## 2023-11-27 DIAGNOSIS — K219 Gastro-esophageal reflux disease without esophagitis: Secondary | ICD-10-CM | POA: Diagnosis not present

## 2023-11-27 DIAGNOSIS — E038 Other specified hypothyroidism: Secondary | ICD-10-CM | POA: Diagnosis not present

## 2023-11-27 DIAGNOSIS — N1831 Chronic kidney disease, stage 3a: Secondary | ICD-10-CM | POA: Diagnosis not present

## 2023-11-27 DIAGNOSIS — I10 Essential (primary) hypertension: Secondary | ICD-10-CM | POA: Diagnosis not present

## 2023-11-27 DIAGNOSIS — K7469 Other cirrhosis of liver: Secondary | ICD-10-CM | POA: Diagnosis not present

## 2023-11-27 DIAGNOSIS — M1732 Unilateral post-traumatic osteoarthritis, left knee: Secondary | ICD-10-CM | POA: Diagnosis not present

## 2023-11-27 DIAGNOSIS — G629 Polyneuropathy, unspecified: Secondary | ICD-10-CM | POA: Diagnosis not present

## 2023-11-27 DIAGNOSIS — G43919 Migraine, unspecified, intractable, without status migrainosus: Secondary | ICD-10-CM | POA: Diagnosis not present

## 2023-11-27 DIAGNOSIS — E1122 Type 2 diabetes mellitus with diabetic chronic kidney disease: Secondary | ICD-10-CM | POA: Diagnosis not present

## 2023-12-12 DIAGNOSIS — I8511 Secondary esophageal varices with bleeding: Secondary | ICD-10-CM | POA: Diagnosis not present

## 2023-12-12 DIAGNOSIS — D731 Hypersplenism: Secondary | ICD-10-CM | POA: Diagnosis not present

## 2023-12-12 DIAGNOSIS — K7460 Unspecified cirrhosis of liver: Secondary | ICD-10-CM | POA: Diagnosis not present

## 2023-12-12 DIAGNOSIS — E611 Iron deficiency: Secondary | ICD-10-CM | POA: Diagnosis not present

## 2023-12-12 DIAGNOSIS — D696 Thrombocytopenia, unspecified: Secondary | ICD-10-CM | POA: Diagnosis not present

## 2023-12-12 DIAGNOSIS — K7581 Nonalcoholic steatohepatitis (NASH): Secondary | ICD-10-CM | POA: Diagnosis not present

## 2023-12-12 DIAGNOSIS — D61818 Other pancytopenia: Secondary | ICD-10-CM | POA: Diagnosis not present

## 2023-12-12 DIAGNOSIS — D500 Iron deficiency anemia secondary to blood loss (chronic): Secondary | ICD-10-CM | POA: Diagnosis not present

## 2023-12-17 DIAGNOSIS — D500 Iron deficiency anemia secondary to blood loss (chronic): Secondary | ICD-10-CM | POA: Diagnosis not present

## 2023-12-17 DIAGNOSIS — K7460 Unspecified cirrhosis of liver: Secondary | ICD-10-CM | POA: Diagnosis not present

## 2023-12-17 DIAGNOSIS — I8511 Secondary esophageal varices with bleeding: Secondary | ICD-10-CM | POA: Diagnosis not present

## 2023-12-17 DIAGNOSIS — D731 Hypersplenism: Secondary | ICD-10-CM | POA: Diagnosis not present

## 2024-01-07 ENCOUNTER — Other Ambulatory Visit: Payer: Self-pay

## 2024-01-07 ENCOUNTER — Encounter (HOSPITAL_COMMUNITY): Payer: Self-pay

## 2024-01-07 ENCOUNTER — Emergency Department (HOSPITAL_COMMUNITY)

## 2024-01-07 ENCOUNTER — Emergency Department (HOSPITAL_COMMUNITY)
Admission: EM | Admit: 2024-01-07 | Discharge: 2024-01-07 | Disposition: A | Discharge status: Home / Self Care | Attending: Emergency Medicine | Admitting: Emergency Medicine

## 2024-01-07 DIAGNOSIS — M1611 Unilateral primary osteoarthritis, right hip: Secondary | ICD-10-CM | POA: Insufficient documentation

## 2024-01-07 DIAGNOSIS — Z7984 Long term (current) use of oral hypoglycemic drugs: Secondary | ICD-10-CM | POA: Insufficient documentation

## 2024-01-07 DIAGNOSIS — I10 Essential (primary) hypertension: Secondary | ICD-10-CM | POA: Diagnosis not present

## 2024-01-07 DIAGNOSIS — M25551 Pain in right hip: Secondary | ICD-10-CM | POA: Diagnosis not present

## 2024-01-07 DIAGNOSIS — M160 Bilateral primary osteoarthritis of hip: Secondary | ICD-10-CM | POA: Diagnosis not present

## 2024-01-07 DIAGNOSIS — E119 Type 2 diabetes mellitus without complications: Secondary | ICD-10-CM | POA: Insufficient documentation

## 2024-01-07 DIAGNOSIS — Z79899 Other long term (current) drug therapy: Secondary | ICD-10-CM | POA: Insufficient documentation

## 2024-01-07 MED ORDER — PREDNISONE 20 MG PO TABS
40.0000 mg | ORAL_TABLET | Freq: Once | ORAL | Status: AC
  Administered 2024-01-07: 40 mg via ORAL
  Filled 2024-01-07: qty 2

## 2024-01-07 MED ORDER — HYDROCODONE-ACETAMINOPHEN 5-325 MG PO TABS
2.0000 | ORAL_TABLET | Freq: Once | ORAL | Status: AC
  Administered 2024-01-07: 2 via ORAL
  Filled 2024-01-07: qty 2

## 2024-01-07 MED ORDER — OXYCODONE HCL 5 MG PO TABS: 5.0000 mg | ORAL_TABLET | Freq: Four times a day (QID) | ORAL | 0 refills | Status: AC | PRN

## 2024-01-07 MED ORDER — PREDNISONE 10 MG PO TABS: 20.0000 mg | ORAL_TABLET | Freq: Two times a day (BID) | ORAL | 0 refills | Status: AC

## 2024-01-07 NOTE — ED Triage Notes (Signed)
 Pt presents to ED ambulatory from home with c/o hip pain since falling 6 months ago, pain restarted about two months ago.

## 2024-01-07 NOTE — ED Provider Notes (Signed)
 [place] EMERGENCY DEPARTMENT AT [facility] Provider Note   CSN: [id number] Arrival date & time: 01/07/24  1821     History  Chief Complaint  Patient presents with   Hip Pain    Priscilla Roberts is a 58 y.o. female.  Patient is a 58 year old female with history of hypertension, hyperlipidemia, type 2 diabetes, and nonalcoholic cirrhosis.  Patient presenting today for evaluation of right hip pain.  She reports falling 6 months ago and has been having difficulty since.  Pain has been worse over the past few days.  Pain is worse with bearing weight and ambulating with no alleviating factors.  She has been taking some Tylenol with little relief.       Home Medications Prior to Admission medications   Medication Sig Start Date End Date Taking? Authorizing Provider  ACCU-CHEK GUIDE test strip  09/30/21   [provider]  chlorpheniramine-HYDROcodone (TUSSIONEX) 10-8 MG/5ML Take 5 mLs by mouth every 12 (twelve) hours as needed for cough. 06/22/23   Danford, Earl Lites, MD  cimetidine (TAGAMET) 400 MG tablet Take 400 mg by mouth 3 (three) times daily as needed. 05/24/23   [provider]  cyanocobalamin 1000 MCG tablet Take 1,000 mcg by mouth daily.    [provider]  ferrous sulfate 325 (65 FE) MG tablet Take 325 mg by mouth at bedtime. 01/08/20   [provider]  folic acid (FOLVITE) 1 MG tablet Take 1 mg by mouth daily.    [provider]  Garlic 1000 MG CAPS Take 1,000 mg by mouth daily.    [provider]  hydrOXYzine (VISTARIL) 25 MG capsule Take 25-50 mg by mouth at bedtime as needed. 05/23/23   [provider]  levothyroxine (SYNTHROID) 100 MCG tablet Take 100 mcg by mouth daily before breakfast.    [provider]  metFORMIN (GLUCOPHAGE) 1000 MG tablet Take 1,000 mg by mouth 2 (two) times daily with a meal.    [provider]  methocarbamol (ROBAXIN) 500 MG tablet Take 1 tablet (500 mg  total) by mouth 3 (three) times daily. 08/07/23   Triplett, Tammy, PA-C  Omega-3 Fatty Acids (FISH OIL) 1000 MG CAPS Take 1,000 mg by mouth in the morning, at noon, and at bedtime.    [provider]  oxyCODONE-acetaminophen (PERCOCET/ROXICET) 5-325 MG tablet Take 1 tablet by mouth every 4 (four) hours as needed. 08/07/23   Triplett, Tammy, PA-C  pantoprazole (PROTONIX) 40 MG tablet Take 40 mg by mouth 2 (two) times daily. 09/04/19   [provider]  PARoxetine (PAXIL) 10 MG tablet Take 10 mg by mouth daily. 09/04/19   [provider]  pregabalin (LYRICA) 300 MG capsule Take 300 mg by mouth 2 (two) times daily. 03/16/20   [provider]  promethazine-dextromethorphan (PROMETHAZINE-DM) 6.25-15 MG/5ML syrup Take by mouth. 06/14/23   [provider]  propranolol (INDERAL) 40 MG tablet Take 40 mg by mouth 2 (two) times daily. 12/02/18   [provider]  topiramate (TOPAMAX) 50 MG tablet Take 50 mg by mouth 2 (two) times daily.    [provider]      Allergies    Prednisone, Tizanidine, Chocolate flavoring agent (non-screening), Lorazepam, Other, and Tramadol    Review of Systems   Review of Systems  All other systems reviewed and are negative.   Physical Exam Updated Vital Signs BP (!) 167/81 (BP Location: Right Arm)   Pulse 63   Temp 97.8 F (36.6 C) (Oral)  Resp 18   Ht 5\' 7"  (1.702 m)   Wt 99.8 kg   SpO2 91%   BMI 34.46 kg/m  Physical Exam Vitals and nursing note reviewed.  Constitutional:      Appearance: Normal appearance.  Pulmonary:     Effort: Pulmonary effort is normal.  Musculoskeletal:     Comments: The right hip is grossly normal in appearance.  She does have pain with range of motion.  DP pulses are palpable to the right foot.  Motor and sensation intact throughout the entire right foot.  Skin:    General: Skin is warm and dry.  Neurological:     Mental Status: She is alert and oriented to person, place, and  time.     ED Results / Procedures / Treatments   Labs (all labs ordered are listed, but only abnormal results are displayed) Labs Reviewed - No data to display  EKG None  Radiology DG Hip Unilat  With Pelvis 2-3 Views Right Result Date: 01/07/2024 CLINICAL DATA:  Pain after fall.  Fall 6 months ago. EXAM: DG HIP (WITH OR WITHOUT PELVIS) 2-3V RIGHT COMPARISON:  None Available. FINDINGS: No evidence of acute or healing/healed fracture. 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 Pubic rami are intact. Moderate bilateral sacroiliac degenerative change. No pubic symphyseal or sacroiliac diastasis. IMPRESSION: 1. No acute fracture or subluxation of the right hip. 2. 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 Electronically Signed   By: Narda Rutherford M.D.   On: 01/07/2024 22:01    Procedures Procedures    Medications Ordered in ED Medications  predniSONE (DELTASONE) tablet 40 mg (has no administration in time range)  HYDROcodone-acetaminophen (NORCO/VICODIN) 5-325 MG per tablet 2 tablet (has no administration in time range)    ED Course/ Medical Decision Making/ A&P  Patient presenting with right hip pain as described in the HPI.  She has findings of osteoarthritis on her x-ray and I suspect this to be to the cause.  I will treat patient with a course of prednisone and pain medication and have her follow-up with primary doctor if not improving.  Patient may need orthopedic referral in the future.  Final Clinical Impression(s) / ED Diagnoses Final diagnoses:  None    Rx / DC Orders ED Discharge Orders     None         Geoffery Lyons, MD 01/07/24 2323

## 2024-01-07 NOTE — Discharge Instructions (Signed)
 Begin taking prednisone as prescribed.  Begin taking oxycodone as prescribed as needed for pain.  Follow-up with primary doctor if not improving in the next week.

## 2024-01-17 DIAGNOSIS — Z6835 Body mass index (BMI) 35.0-35.9, adult: Secondary | ICD-10-CM | POA: Diagnosis not present

## 2024-01-17 DIAGNOSIS — I10 Essential (primary) hypertension: Secondary | ICD-10-CM | POA: Diagnosis not present

## 2024-01-17 DIAGNOSIS — M5431 Sciatica, right side: Secondary | ICD-10-CM | POA: Diagnosis not present

## 2024-02-04 DIAGNOSIS — M5431 Sciatica, right side: Secondary | ICD-10-CM | POA: Diagnosis not present

## 2024-02-04 DIAGNOSIS — M5137 Other intervertebral disc degeneration, lumbosacral region with discogenic back pain only: Secondary | ICD-10-CM | POA: Diagnosis not present

## 2024-02-04 DIAGNOSIS — S32039A Unspecified fracture of third lumbar vertebra, initial encounter for closed fracture: Secondary | ICD-10-CM | POA: Diagnosis not present

## 2024-02-04 DIAGNOSIS — M5136 Other intervertebral disc degeneration, lumbar region with discogenic back pain only: Secondary | ICD-10-CM | POA: Diagnosis not present

## 2024-03-17 DIAGNOSIS — K7460 Unspecified cirrhosis of liver: Secondary | ICD-10-CM | POA: Diagnosis not present

## 2024-03-17 DIAGNOSIS — D61818 Other pancytopenia: Secondary | ICD-10-CM | POA: Diagnosis not present

## 2024-03-17 DIAGNOSIS — I8511 Secondary esophageal varices with bleeding: Secondary | ICD-10-CM | POA: Diagnosis not present

## 2024-03-17 DIAGNOSIS — D500 Iron deficiency anemia secondary to blood loss (chronic): Secondary | ICD-10-CM | POA: Diagnosis not present

## 2024-03-17 DIAGNOSIS — D731 Hypersplenism: Secondary | ICD-10-CM | POA: Diagnosis not present

## 2024-03-24 DIAGNOSIS — K7460 Unspecified cirrhosis of liver: Secondary | ICD-10-CM | POA: Diagnosis not present

## 2024-03-24 DIAGNOSIS — I8511 Secondary esophageal varices with bleeding: Secondary | ICD-10-CM | POA: Diagnosis not present

## 2024-03-24 DIAGNOSIS — D500 Iron deficiency anemia secondary to blood loss (chronic): Secondary | ICD-10-CM | POA: Diagnosis not present

## 2024-03-24 DIAGNOSIS — D731 Hypersplenism: Secondary | ICD-10-CM | POA: Diagnosis not present

## 2024-04-09 DIAGNOSIS — H524 Presbyopia: Secondary | ICD-10-CM | POA: Diagnosis not present

## 2024-04-09 DIAGNOSIS — H5203 Hypermetropia, bilateral: Secondary | ICD-10-CM | POA: Diagnosis not present

## 2024-04-09 DIAGNOSIS — H52223 Regular astigmatism, bilateral: Secondary | ICD-10-CM | POA: Diagnosis not present

## 2024-05-08 DIAGNOSIS — H43813 Vitreous degeneration, bilateral: Secondary | ICD-10-CM | POA: Diagnosis not present

## 2024-05-08 DIAGNOSIS — E113393 Type 2 diabetes mellitus with moderate nonproliferative diabetic retinopathy without macular edema, bilateral: Secondary | ICD-10-CM | POA: Diagnosis not present

## 2024-05-08 DIAGNOSIS — H348312 Tributary (branch) retinal vein occlusion, right eye, stable: Secondary | ICD-10-CM | POA: Diagnosis not present

## 2024-05-08 DIAGNOSIS — H35033 Hypertensive retinopathy, bilateral: Secondary | ICD-10-CM | POA: Diagnosis not present

## 2024-05-27 DIAGNOSIS — E669 Obesity, unspecified: Secondary | ICD-10-CM | POA: Diagnosis not present

## 2024-05-27 DIAGNOSIS — I10 Essential (primary) hypertension: Secondary | ICD-10-CM | POA: Diagnosis not present

## 2024-05-27 DIAGNOSIS — F331 Major depressive disorder, recurrent, moderate: Secondary | ICD-10-CM | POA: Diagnosis not present

## 2024-05-27 DIAGNOSIS — G43919 Migraine, unspecified, intractable, without status migrainosus: Secondary | ICD-10-CM | POA: Diagnosis not present

## 2024-05-27 DIAGNOSIS — E038 Other specified hypothyroidism: Secondary | ICD-10-CM | POA: Diagnosis not present

## 2024-05-27 DIAGNOSIS — E1143 Type 2 diabetes mellitus with diabetic autonomic (poly)neuropathy: Secondary | ICD-10-CM | POA: Diagnosis not present

## 2024-05-27 DIAGNOSIS — N1831 Chronic kidney disease, stage 3a: Secondary | ICD-10-CM | POA: Diagnosis not present

## 2024-05-27 DIAGNOSIS — E1122 Type 2 diabetes mellitus with diabetic chronic kidney disease: Secondary | ICD-10-CM | POA: Diagnosis not present

## 2024-05-27 DIAGNOSIS — K219 Gastro-esophageal reflux disease without esophagitis: Secondary | ICD-10-CM | POA: Diagnosis not present

## 2024-05-27 DIAGNOSIS — Z0000 Encounter for general adult medical examination without abnormal findings: Secondary | ICD-10-CM | POA: Diagnosis not present

## 2024-05-27 DIAGNOSIS — N182 Chronic kidney disease, stage 2 (mild): Secondary | ICD-10-CM | POA: Diagnosis not present

## 2024-05-27 DIAGNOSIS — G629 Polyneuropathy, unspecified: Secondary | ICD-10-CM | POA: Diagnosis not present

## 2024-07-02 DIAGNOSIS — Z1231 Encounter for screening mammogram for malignant neoplasm of breast: Secondary | ICD-10-CM | POA: Diagnosis not present

## 2024-07-14 DIAGNOSIS — E1122 Type 2 diabetes mellitus with diabetic chronic kidney disease: Secondary | ICD-10-CM | POA: Diagnosis not present

## 2024-07-14 DIAGNOSIS — E669 Obesity, unspecified: Secondary | ICD-10-CM | POA: Diagnosis not present

## 2024-07-14 DIAGNOSIS — M519 Unspecified thoracic, thoracolumbar and lumbosacral intervertebral disc disorder: Secondary | ICD-10-CM | POA: Diagnosis not present

## 2024-07-14 DIAGNOSIS — F32A Depression, unspecified: Secondary | ICD-10-CM | POA: Diagnosis not present

## 2024-07-14 DIAGNOSIS — I209 Angina pectoris, unspecified: Secondary | ICD-10-CM | POA: Diagnosis not present

## 2024-07-14 DIAGNOSIS — I129 Hypertensive chronic kidney disease with stage 1 through stage 4 chronic kidney disease, or unspecified chronic kidney disease: Secondary | ICD-10-CM | POA: Diagnosis not present

## 2024-07-14 DIAGNOSIS — K219 Gastro-esophageal reflux disease without esophagitis: Secondary | ICD-10-CM | POA: Diagnosis not present

## 2024-07-14 DIAGNOSIS — M5440 Lumbago with sciatica, unspecified side: Secondary | ICD-10-CM | POA: Diagnosis not present

## 2024-07-14 DIAGNOSIS — E785 Hyperlipidemia, unspecified: Secondary | ICD-10-CM | POA: Diagnosis not present

## 2024-07-14 DIAGNOSIS — F411 Generalized anxiety disorder: Secondary | ICD-10-CM | POA: Diagnosis not present

## 2024-07-14 DIAGNOSIS — Z809 Family history of malignant neoplasm, unspecified: Secondary | ICD-10-CM | POA: Diagnosis not present

## 2024-07-14 DIAGNOSIS — D509 Iron deficiency anemia, unspecified: Secondary | ICD-10-CM | POA: Diagnosis not present

## 2024-07-14 DIAGNOSIS — E1142 Type 2 diabetes mellitus with diabetic polyneuropathy: Secondary | ICD-10-CM | POA: Diagnosis not present

## 2024-07-14 DIAGNOSIS — G43909 Migraine, unspecified, not intractable, without status migrainosus: Secondary | ICD-10-CM | POA: Diagnosis not present

## 2024-07-14 DIAGNOSIS — J449 Chronic obstructive pulmonary disease, unspecified: Secondary | ICD-10-CM | POA: Diagnosis not present

## 2024-07-14 DIAGNOSIS — K7460 Unspecified cirrhosis of liver: Secondary | ICD-10-CM | POA: Diagnosis not present

## 2024-07-14 DIAGNOSIS — M1990 Unspecified osteoarthritis, unspecified site: Secondary | ICD-10-CM | POA: Diagnosis not present

## 2024-07-14 DIAGNOSIS — N1831 Chronic kidney disease, stage 3a: Secondary | ICD-10-CM | POA: Diagnosis not present

## 2024-07-16 ENCOUNTER — Encounter (INDEPENDENT_AMBULATORY_CARE_PROVIDER_SITE_OTHER): Payer: Self-pay | Admitting: Gastroenterology

## 2024-08-19 DIAGNOSIS — K219 Gastro-esophageal reflux disease without esophagitis: Secondary | ICD-10-CM | POA: Diagnosis not present

## 2024-08-19 DIAGNOSIS — I10 Essential (primary) hypertension: Secondary | ICD-10-CM | POA: Diagnosis not present

## 2024-08-19 DIAGNOSIS — E038 Other specified hypothyroidism: Secondary | ICD-10-CM | POA: Diagnosis not present

## 2024-08-19 DIAGNOSIS — N1831 Chronic kidney disease, stage 3a: Secondary | ICD-10-CM | POA: Diagnosis not present

## 2024-08-19 DIAGNOSIS — M5431 Sciatica, right side: Secondary | ICD-10-CM | POA: Diagnosis not present

## 2024-08-19 DIAGNOSIS — G629 Polyneuropathy, unspecified: Secondary | ICD-10-CM | POA: Diagnosis not present

## 2024-08-19 DIAGNOSIS — G43919 Migraine, unspecified, intractable, without status migrainosus: Secondary | ICD-10-CM | POA: Diagnosis not present

## 2024-08-19 DIAGNOSIS — E1122 Type 2 diabetes mellitus with diabetic chronic kidney disease: Secondary | ICD-10-CM | POA: Diagnosis not present

## 2024-08-19 DIAGNOSIS — F331 Major depressive disorder, recurrent, moderate: Secondary | ICD-10-CM | POA: Diagnosis not present

## 2024-09-02 DIAGNOSIS — S3993XA Unspecified injury of pelvis, initial encounter: Secondary | ICD-10-CM | POA: Diagnosis not present

## 2024-09-02 DIAGNOSIS — J449 Chronic obstructive pulmonary disease, unspecified: Secondary | ICD-10-CM | POA: Diagnosis not present

## 2024-09-02 DIAGNOSIS — S199XXA Unspecified injury of neck, initial encounter: Secondary | ICD-10-CM | POA: Diagnosis not present

## 2024-09-02 DIAGNOSIS — E079 Disorder of thyroid, unspecified: Secondary | ICD-10-CM | POA: Diagnosis not present

## 2024-09-02 DIAGNOSIS — G8911 Acute pain due to trauma: Secondary | ICD-10-CM | POA: Diagnosis not present

## 2024-09-02 DIAGNOSIS — S99911A Unspecified injury of right ankle, initial encounter: Secondary | ICD-10-CM | POA: Diagnosis not present

## 2024-09-02 DIAGNOSIS — E785 Hyperlipidemia, unspecified: Secondary | ICD-10-CM | POA: Diagnosis not present

## 2024-09-02 DIAGNOSIS — Z043 Encounter for examination and observation following other accident: Secondary | ICD-10-CM | POA: Diagnosis not present

## 2024-09-02 DIAGNOSIS — W19XXXA Unspecified fall, initial encounter: Secondary | ICD-10-CM | POA: Diagnosis not present

## 2024-09-02 DIAGNOSIS — R161 Splenomegaly, not elsewhere classified: Secondary | ICD-10-CM | POA: Diagnosis not present

## 2024-09-02 DIAGNOSIS — S0990XA Unspecified injury of head, initial encounter: Secondary | ICD-10-CM | POA: Diagnosis not present

## 2024-09-02 DIAGNOSIS — M542 Cervicalgia: Secondary | ICD-10-CM | POA: Diagnosis not present

## 2024-09-02 DIAGNOSIS — S299XXA Unspecified injury of thorax, initial encounter: Secondary | ICD-10-CM | POA: Diagnosis not present

## 2024-09-02 DIAGNOSIS — S3991XA Unspecified injury of abdomen, initial encounter: Secondary | ICD-10-CM | POA: Diagnosis not present

## 2024-09-02 DIAGNOSIS — S098XXA Other specified injuries of head, initial encounter: Secondary | ICD-10-CM | POA: Diagnosis not present

## 2024-09-02 DIAGNOSIS — E119 Type 2 diabetes mellitus without complications: Secondary | ICD-10-CM | POA: Diagnosis not present

## 2024-09-02 DIAGNOSIS — I10 Essential (primary) hypertension: Secondary | ICD-10-CM | POA: Diagnosis not present

## 2024-09-02 DIAGNOSIS — W010XXA Fall on same level from slipping, tripping and stumbling without subsequent striking against object, initial encounter: Secondary | ICD-10-CM | POA: Diagnosis not present

## 2024-09-02 DIAGNOSIS — S6991XA Unspecified injury of right wrist, hand and finger(s), initial encounter: Secondary | ICD-10-CM | POA: Diagnosis not present

## 2024-09-02 DIAGNOSIS — F1290 Cannabis use, unspecified, uncomplicated: Secondary | ICD-10-CM | POA: Diagnosis not present

## 2024-09-02 DIAGNOSIS — Z87891 Personal history of nicotine dependence: Secondary | ICD-10-CM | POA: Diagnosis not present

## 2024-09-02 DIAGNOSIS — M25519 Pain in unspecified shoulder: Secondary | ICD-10-CM | POA: Diagnosis not present

## 2024-11-12 ENCOUNTER — Ambulatory Visit (INDEPENDENT_AMBULATORY_CARE_PROVIDER_SITE_OTHER): Admitting: Gastroenterology
